# Patient Record
Sex: Female | Born: 1975 | Race: White | Hispanic: No | Marital: Married | Smoking: Never smoker
Health system: Southern US, Community
[De-identification: ages and names within clinical notes are randomized; demographics above are authoritative.]

## PROBLEM LIST (undated history)

## (undated) DIAGNOSIS — E559 Vitamin D deficiency, unspecified: Secondary | ICD-10-CM

## (undated) DIAGNOSIS — N390 Urinary tract infection, site not specified: Secondary | ICD-10-CM

## (undated) DIAGNOSIS — B343 Parvovirus infection, unspecified: Secondary | ICD-10-CM

## (undated) DIAGNOSIS — T7840XA Allergy, unspecified, initial encounter: Secondary | ICD-10-CM

## (undated) DIAGNOSIS — Z8669 Personal history of other diseases of the nervous system and sense organs: Secondary | ICD-10-CM

## (undated) DIAGNOSIS — K219 Gastro-esophageal reflux disease without esophagitis: Secondary | ICD-10-CM

## (undated) DIAGNOSIS — B029 Zoster without complications: Secondary | ICD-10-CM

## (undated) HISTORY — DX: Urinary tract infection, site not specified: N39.0

## (undated) HISTORY — PX: WISDOM TOOTH EXTRACTION: SHX21

## (undated) HISTORY — DX: Personal history of other diseases of the nervous system and sense organs: Z86.69

## (undated) HISTORY — DX: Gastro-esophageal reflux disease without esophagitis: K21.9

## (undated) HISTORY — DX: Zoster without complications: B02.9

## (undated) HISTORY — PX: COLONOSCOPY: SHX174

## (undated) HISTORY — DX: Vitamin D deficiency, unspecified: E55.9

## (undated) HISTORY — DX: Parvovirus infection, unspecified: B34.3

## (undated) HISTORY — DX: Allergy, unspecified, initial encounter: T78.40XA

---

## 2000-02-03 ENCOUNTER — Other Ambulatory Visit: Admission: RE | Admit: 2000-02-03 | Discharge: 2000-02-03 | Payer: Self-pay | Admitting: *Deleted

## 2001-02-24 ENCOUNTER — Other Ambulatory Visit: Admission: RE | Admit: 2001-02-24 | Discharge: 2001-02-24 | Payer: Self-pay | Admitting: *Deleted

## 2002-02-09 ENCOUNTER — Other Ambulatory Visit: Admission: RE | Admit: 2002-02-09 | Discharge: 2002-02-09 | Payer: Self-pay | Admitting: *Deleted

## 2003-02-19 ENCOUNTER — Other Ambulatory Visit: Admission: RE | Admit: 2003-02-19 | Discharge: 2003-02-19 | Payer: Self-pay | Admitting: *Deleted

## 2004-07-28 ENCOUNTER — Inpatient Hospital Stay (HOSPITAL_COMMUNITY): Admission: AD | Admit: 2004-07-28 | Discharge: 2004-07-28 | Payer: Self-pay | Admitting: Obstetrics and Gynecology

## 2004-07-29 ENCOUNTER — Ambulatory Visit: Payer: Self-pay | Admitting: Neonatology

## 2004-07-29 ENCOUNTER — Inpatient Hospital Stay (HOSPITAL_COMMUNITY): Admission: AD | Admit: 2004-07-29 | Discharge: 2004-07-31 | Payer: Self-pay | Admitting: Obstetrics and Gynecology

## 2004-08-16 ENCOUNTER — Inpatient Hospital Stay (HOSPITAL_COMMUNITY): Admission: AD | Admit: 2004-08-16 | Discharge: 2004-08-16 | Payer: Self-pay | Admitting: Obstetrics & Gynecology

## 2004-08-22 ENCOUNTER — Inpatient Hospital Stay (HOSPITAL_COMMUNITY): Admission: AD | Admit: 2004-08-22 | Discharge: 2004-08-24 | Payer: Self-pay | Admitting: Obstetrics and Gynecology

## 2004-08-25 ENCOUNTER — Encounter: Admission: RE | Admit: 2004-08-25 | Discharge: 2004-09-24 | Payer: Self-pay | Admitting: Obstetrics and Gynecology

## 2004-10-25 ENCOUNTER — Encounter: Admission: RE | Admit: 2004-10-25 | Discharge: 2004-11-24 | Payer: Self-pay | Admitting: Obstetrics and Gynecology

## 2004-11-25 ENCOUNTER — Encounter: Admission: RE | Admit: 2004-11-25 | Discharge: 2004-12-24 | Payer: Self-pay | Admitting: Obstetrics and Gynecology

## 2004-12-25 ENCOUNTER — Encounter: Admission: RE | Admit: 2004-12-25 | Discharge: 2005-01-24 | Payer: Self-pay | Admitting: Obstetrics and Gynecology

## 2005-01-25 ENCOUNTER — Encounter: Admission: RE | Admit: 2005-01-25 | Discharge: 2005-02-23 | Payer: Self-pay | Admitting: Obstetrics and Gynecology

## 2005-02-24 ENCOUNTER — Encounter: Admission: RE | Admit: 2005-02-24 | Discharge: 2005-03-26 | Payer: Self-pay | Admitting: Obstetrics and Gynecology

## 2005-03-27 ENCOUNTER — Encounter: Admission: RE | Admit: 2005-03-27 | Discharge: 2005-04-26 | Payer: Self-pay | Admitting: Obstetrics and Gynecology

## 2006-11-05 ENCOUNTER — Inpatient Hospital Stay (HOSPITAL_COMMUNITY): Admission: AD | Admit: 2006-11-05 | Discharge: 2006-11-07 | Payer: Self-pay | Admitting: Obstetrics and Gynecology

## 2006-11-08 ENCOUNTER — Encounter: Admission: RE | Admit: 2006-11-08 | Discharge: 2006-12-08 | Payer: Self-pay | Admitting: Obstetrics and Gynecology

## 2006-12-09 ENCOUNTER — Encounter: Admission: RE | Admit: 2006-12-09 | Discharge: 2007-01-07 | Payer: Self-pay | Admitting: Obstetrics and Gynecology

## 2007-01-08 ENCOUNTER — Encounter: Admission: RE | Admit: 2007-01-08 | Discharge: 2007-02-07 | Payer: Self-pay | Admitting: Obstetrics and Gynecology

## 2007-02-08 ENCOUNTER — Encounter: Admission: RE | Admit: 2007-02-08 | Discharge: 2007-03-09 | Payer: Self-pay | Admitting: Obstetrics and Gynecology

## 2007-03-10 ENCOUNTER — Encounter: Admission: RE | Admit: 2007-03-10 | Discharge: 2007-04-09 | Payer: Self-pay | Admitting: Obstetrics and Gynecology

## 2007-04-10 ENCOUNTER — Encounter: Admission: RE | Admit: 2007-04-10 | Discharge: 2007-05-10 | Payer: Self-pay | Admitting: Obstetrics and Gynecology

## 2007-05-11 ENCOUNTER — Encounter: Admission: RE | Admit: 2007-05-11 | Discharge: 2007-06-07 | Payer: Self-pay | Admitting: Obstetrics and Gynecology

## 2007-06-08 ENCOUNTER — Encounter: Admission: RE | Admit: 2007-06-08 | Discharge: 2007-07-08 | Payer: Self-pay | Admitting: Obstetrics and Gynecology

## 2007-07-03 ENCOUNTER — Encounter: Admission: RE | Admit: 2007-07-03 | Discharge: 2007-07-03 | Payer: Self-pay | Admitting: Sports Medicine

## 2007-07-09 ENCOUNTER — Encounter: Admission: RE | Admit: 2007-07-09 | Discharge: 2007-08-07 | Payer: Self-pay | Admitting: Obstetrics and Gynecology

## 2007-08-08 ENCOUNTER — Encounter: Admission: RE | Admit: 2007-08-08 | Discharge: 2007-09-04 | Payer: Self-pay | Admitting: Obstetrics and Gynecology

## 2009-03-22 DIAGNOSIS — B029 Zoster without complications: Secondary | ICD-10-CM

## 2009-03-22 HISTORY — DX: Zoster without complications: B02.9

## 2010-01-26 ENCOUNTER — Encounter: Admission: RE | Admit: 2010-01-26 | Discharge: 2010-01-26 | Payer: Self-pay | Admitting: Family Medicine

## 2010-03-22 HISTORY — PX: ESOPHAGOGASTRODUODENOSCOPY ENDOSCOPY: SHX5814

## 2010-08-07 NOTE — Op Note (Signed)
Danielle [name], SCHOOLER               ACCOUNT NO.:  [id number]   MEDICAL RECORD NO.:  [id number]          PATIENT TYPE:  INP   LOCATION:  9126                          FACILITY:  [facility]   PHYSICIAN:  Lenoard Aden, M.D.DATE OF BIRTH:  June 23, 1975   DATE OF PROCEDURE:  08/22/2004  DATE OF DISCHARGE:                                 OPERATIVE REPORT   PREOPERATIVE DIAGNOSIS:  Decreased fetal heart rate and increased perineal  bleeding.   POSTOPERATIVE DIAGNOSIS:  Decreased fetal heart rate and increased perineal  bleeding.   OPERATION/PROCEDURE:  Outlet vacuum-assisted vaginal delivery with Kiwi.   SURGEON:  Lenoard Aden, M.D.   ANESTHESIA:  Epidural.   ESTIMATED BLOOD LOSS:  500 mL.   COMPLICATIONS:  None.   DRAINS:  None.   COUNTS:  Correct.   DISPOSITION:  The patient to recovery in good condition.   DESCRIPTION OF PROCEDURE:  Being apprised of the risks of vacuum-assistance  including cephalhematoma, scalp laceration, intracranial __________.  Kiwi  cup was placed at fetal vertex, occiput anterior position, less than 45  degrees, +3 to +4 station for two pulls over a second-degree midline  laceration of a full-term living female, Apgars 9 and 9.  Cord blood was  collected.  Placenta delivered spontaneous, intact three-vessel cord noted.  Bulb suctioning performed.  Estimated blood loss was 500 mL.  No vaginal  lacerations noted.  Repair in the standard fashion using 3-0 repeat and a 4-  0 repeat on superficial skin laceration on the bilateral labia minora,  interrupted sutures placed.  At this time good hemostasis is noted.  The  patient tolerated the procedure well and was recovering in good condition.      RJT/MEDQ  D:  08/22/2004  T:  08/23/2004  Job:  [id number]

## 2010-08-07 NOTE — H&P (Signed)
Danielle [name], Danielle [name]               ACCOUNT NO.:  [id number]   MEDICAL RECORD NO.:  [id number]          PATIENT TYPE:  MAT   LOCATION:  [facility]C                          F[facility]ILITY:  [facility]   PHYSICIAN:  Lenoard Aden, M.D.DATE OF BIRTH:  [birth year]   DATE OF ADMISSION:  DATE OF DISCHARGE:                                HISTORY & PHYSICAL   CHIEF COMPLAINT:  Preterm labor.   HISTORY OF PRESENT ILLNESS:  The patient is a 35 year old white female G1 P0  with EDD September 12, 2004 at 56 and five-sevenths weeks with preterm cervical  change status post betamethasone #2 today with subtle cervical change and  increased frequency of contractions noted on Procardia today. She has a  history of wisdom tooth removal. She has a family history of breast,  prostate, and lung cancer; tuberculosis; diabetes; stroke; rheumatoid  arthritis; hypertension; and heart disease.   PRENATAL LABORATORY DATA:  Reveals a blood type of O positive, Rh antibody  negative. Rubella immune. Hepatitis and HIV nonreactive. GBS will be pending  today.   PHYSICAL EXAMINATION:  GENERAL:  She is a well-developed, well-nourished  white female in no acute distress.  HEENT: Normal, lungs clear.  HEART:  Regular rhythm.  ABDOMEN:  Soft, gravid, nontender.  EXTREMITIES:  Show no cords.  NEUROLOGIC:  Nonfocal.  PELVIC:  Cervix is 3 cm, approximately 1.5 cm long, vertex, 0 station.  EXTREMITIES:  Show no cords.  NEUROLOGIC:  Nonfocal.   IMPRESSION:  A 33 and five-sevenths weeks intrauterine pregnancy with  preterm cervical change.   PLAN:  Admit, administer prophylactic antibiotics until GBS is resulted.  Continue Procardia. Will performed aggressive tocolysis in the next 24-48  hours as needed and monitor continuously at this time.      RJT/MEDQ  D:  07/29/2004  T:  07/29/2004  Job:  [id number]

## 2010-08-07 NOTE — Consult Note (Signed)
NAMELAWANDA, HOLZHEIMER               ACCOUNT NO.:  [id number]   MEDICAL RECORD NO.:  [id number]          PATIENT TYPE:  MAT   LOCATION:  [facility]C                          [facility]ILITY:  [facility]   PHYSICIAN:  Lenoard Aden, M.D.DATE OF BIRTH:  [birth year]   DATE OF CONSULTATION:  08/16/2004  DATE OF DISCHARGE:                                   CONSULTATION   CHIEF COMPLAINT:  Rule out labor.   HISTORY OF PRESENT ILLNESS:  The patient is a 35 year old white female, G1,  P0, EDD of September 13, 2003 at 36 weeks and one day who presents with increased  frequency of contractions today.   ALLERGIES:  She has no known drug allergies.   MEDICATIONS:  Prenatal vitamins and Procardia p.r.n.   PAST MEDICAL HISTORY:  She has history of wisdom tooth removal. No other  medical or surgical hospitalizations.   FAMILY HISTORY:  Family history of diabetes, strokes, rheumatoid arthritis.  Lung, prostate, and breast cancer.  Hypertension and varicosities.   PRENATAL LABORATORY DATA:  Remarkable for the patient being GBS positive,  blood type O positive, rubella immuned. Hepatitis/HIV negative. Ultrasound  has revealed an appropriately grown baby. The patient's pregnancy is  complicated by a preterm cervical change and a positive fibronectin. She did  receive steroids at 32 weeks.   PHYSICAL EXAMINATION:  GENERAL:  She is a well-developed, well-nourished  white female in no acute distress.  HEENT:  Normal.  LUNGS:  Clear.  HEART:  Regular rhythm.  ABDOMEN:  Soft, gravid, and nontender.  EXTREMITIES:  No cords.  NEUROLOGICAL:  Nonfocal.  PELVIC:  Cervix is 4 cm dilated, 1-1/2 cm long, vertex and 0 station. Fetal  heart rate is reactive. Contractions are every two to six minutes.   IMPRESSION:  1. A 36-1/7th week intrauterine pregnancy.  2. History of preterm cervical change with no change in cervical exam      noted from previous visit to the office.     PLAN:  Manage expectantly, monitor x1 additional  hour, recheck cervix. If  cervical change noted, we will admit and allow her to labor. However, if no  cervical change is noted, we will possibly discharge home. Have also  discussed the possibility of expectant management, IV fluids, and  intravenous antibiotics upon observation should her contractions not space  out or her labor becomes more uncomfortable. The patient and her husband are  in attendance for the plan. They understand the plan and agree.      RJT/MEDQ  D:  08/16/2004  T:  08/16/2004  Job:  [id number]   cc:   Ma Hillock

## 2010-08-07 NOTE — H&P (Signed)
Danielle [name], HAGSTROM               ACCOUNT NO.:  [id number]   MEDICAL RECORD NO.:  [id number]          PATIENT TYPE:  INP   LOCATION:  9169                          [facility]ACILITY:  [facility]   PHYSICIAN:  Lenoard Aden, M.D.DATE OF BIRTH:  1975/04/16   DATE OF ADMISSION:  08/22/2004  DATE OF DISCHARGE:                                HISTORY & PHYSICAL   CHIEF COMPLAINT:  Advanced dilatation and GBS positive at term.   HISTORY OF PRESENT ILLNESS:  The patient is a 35 year old white female G1,  P0, EDC of September 12, 2004 who presents at 37 weeks with advanced dilatation,  GBS positive and persisting contractions.   ALLERGIES:  No known drug allergies.   MEDICATIONS:  Prenatal vitamins.   Noncontributory pregnancy history.   FAMILY HISTORY:  Prostate history, lung, breast cancer. Stroke, diabetes,  tuberculosis, hypertension.   The patient has a personal history of wisdom tooth extraction.   PRENATAL LAB DATA:  Reveals a blood type of O positive. Hepatitis, HIV  negative. Toxoplasmosis negative. VDRL nonreactive. GBS positive.   Pregnancy course complicated by advanced cervical dilatation, status post  betamethasone due to preterm cervical change.   PHYSICAL EXAMINATION:  GENERAL:  She is a well-developed, well-nourished  white female in no acute distress.  HEENT:  Normal.  LUNGS:  Clear.  HEART:  Regular rhythm.  ABDOMEN:  Soft, gravid, nontender. Estimated fetal weight 6.5 pounds.  CERVIX:  Is 4 to 5 cm, presentation vertex, 0 to +1 station.  EXTREMITIES:  Show no cords.  NEUROLOGIC EXAM:  Nonfocal.   IMPRESSION:  1.  A 37-week OB.  2.  GBS positive.  3.  Advanced dilatation.   PLAN:  Proceed with induction. Risks, benefits discussed. Small risk of  prematurity noted. Patient acknowledges, will proceed. We are using  Penicillin for prophylaxis. Epidural as needed.      RJT/MEDQ  D:  08/22/2004  T:  08/22/2004  Job:  [id number]

## 2011-01-01 LAB — CBC
MCHC: 36
MCV: 98.2
Platelets: 123 — ABNORMAL LOW
Platelets: 147 — ABNORMAL LOW
RDW: 12.3
WBC: 11.6 — ABNORMAL HIGH

## 2011-01-01 LAB — RPR: RPR Ser Ql: NONREACTIVE

## 2011-01-01 LAB — CCBB MATERNAL DONOR DRAW

## 2011-03-23 DIAGNOSIS — B343 Parvovirus infection, unspecified: Secondary | ICD-10-CM

## 2011-03-23 HISTORY — DX: Parvovirus infection, unspecified: B34.3

## 2011-09-30 ENCOUNTER — Ambulatory Visit (HOSPITAL_COMMUNITY)
Admission: RE | Admit: 2011-09-30 | Discharge: 2011-09-30 | Disposition: A | Discharge status: Home / Self Care | Payer: BC Managed Care – PPO | Source: Ambulatory Visit | Attending: Internal Medicine | Admitting: Internal Medicine

## 2011-09-30 ENCOUNTER — Other Ambulatory Visit (HOSPITAL_COMMUNITY): Payer: Self-pay | Admitting: Internal Medicine

## 2011-09-30 DIAGNOSIS — R5383 Other fatigue: Secondary | ICD-10-CM | POA: Insufficient documentation

## 2011-09-30 DIAGNOSIS — R5381 Other malaise: Secondary | ICD-10-CM | POA: Insufficient documentation

## 2011-09-30 DIAGNOSIS — R0602 Shortness of breath: Secondary | ICD-10-CM | POA: Insufficient documentation

## 2011-09-30 DIAGNOSIS — R209 Unspecified disturbances of skin sensation: Secondary | ICD-10-CM | POA: Insufficient documentation

## 2011-09-30 DIAGNOSIS — M79609 Pain in unspecified limb: Secondary | ICD-10-CM

## 2012-12-29 ENCOUNTER — Encounter: Payer: Self-pay | Admitting: *Deleted

## 2012-12-29 DIAGNOSIS — E559 Vitamin D deficiency, unspecified: Secondary | ICD-10-CM | POA: Insufficient documentation

## 2012-12-29 DIAGNOSIS — K219 Gastro-esophageal reflux disease without esophagitis: Secondary | ICD-10-CM | POA: Insufficient documentation

## 2012-12-29 DIAGNOSIS — N390 Urinary tract infection, site not specified: Secondary | ICD-10-CM

## 2013-01-23 ENCOUNTER — Other Ambulatory Visit: Payer: Self-pay | Admitting: Internal Medicine

## 2013-01-23 ENCOUNTER — Encounter: Payer: Self-pay | Admitting: Internal Medicine

## 2013-01-23 ENCOUNTER — Encounter (INDEPENDENT_AMBULATORY_CARE_PROVIDER_SITE_OTHER): Payer: Self-pay

## 2013-01-23 ENCOUNTER — Ambulatory Visit: Payer: BC Managed Care – PPO | Admitting: Internal Medicine

## 2013-01-23 VITALS — BP 106/66 | HR 68 | Temp 99.1°F | Resp 18 | Ht 65.5 in | Wt 121.6 lb

## 2013-01-23 DIAGNOSIS — Z0000 Encounter for general adult medical examination without abnormal findings: Secondary | ICD-10-CM

## 2013-01-23 DIAGNOSIS — Z1212 Encounter for screening for malignant neoplasm of rectum: Secondary | ICD-10-CM

## 2013-01-23 DIAGNOSIS — E559 Vitamin D deficiency, unspecified: Secondary | ICD-10-CM

## 2013-01-23 DIAGNOSIS — Z113 Encounter for screening for infections with a predominantly sexual mode of transmission: Secondary | ICD-10-CM

## 2013-01-23 LAB — CBC WITH DIFFERENTIAL/PLATELET
Eosinophils Absolute: 0.1 10*3/uL (ref 0.0–0.7)
Eosinophils Relative: 1 % (ref 0–5)
Lymphs Abs: 1.4 10*3/uL (ref 0.7–4.0)
MCH: 31.7 pg (ref 26.0–34.0)
MCV: 95.2 fL (ref 78.0–100.0)
Monocytes Relative: 6 % (ref 3–12)
Platelets: 184 10*3/uL (ref 150–400)
RBC: 4.19 MIL/uL (ref 3.87–5.11)

## 2013-01-23 LAB — HEMOGLOBIN A1C: Mean Plasma Glucose: 100 mg/dL (ref <117)

## 2013-01-23 LAB — MAGNESIUM: Magnesium: 2 mg/dL (ref 1.5–2.5)

## 2013-01-23 LAB — BASIC METABOLIC PANEL WITH GFR
Chloride: 104 mEq/L (ref 96–112)
Creat: 0.87 mg/dL (ref 0.50–1.10)
GFR, Est Non African American: 85 mL/min
Potassium: 4.2 mEq/L (ref 3.5–5.3)
Sodium: 137 mEq/L (ref 135–145)

## 2013-01-23 LAB — VITAMIN B12: Vitamin B-12: 637 pg/mL (ref 211–911)

## 2013-01-23 LAB — LIPID PANEL
Cholesterol: 167 mg/dL (ref 0–200)
Total CHOL/HDL Ratio: 2.3 Ratio

## 2013-01-23 LAB — HEPATIC FUNCTION PANEL
AST: 15 U/L (ref 0–37)
Albumin: 4.4 g/dL (ref 3.5–5.2)
Alkaline Phosphatase: 48 U/L (ref 39–117)
Bilirubin, Direct: 0.1 mg/dL (ref 0.0–0.3)
Indirect Bilirubin: 0.6 mg/dL (ref 0.0–0.9)
Total Bilirubin: 0.7 mg/dL (ref 0.3–1.2)

## 2013-01-23 LAB — IRON AND TIBC
%SAT: 15 % — ABNORMAL LOW (ref 20–55)
Iron: 59 ug/dL (ref 42–145)
TIBC: 383 ug/dL (ref 250–470)

## 2013-01-23 MED ORDER — VITAMIN D 50 MCG (2000 UT) PO TABS: 2000.0000 [IU] | ORAL_TABLET | Freq: Every day | ORAL | Status: DC

## 2013-01-23 NOTE — Progress Notes (Signed)
Patient ID: Danielle [name], female   DOB: [birth year], 37 y.o.   MRN: [id number]  Complete Physical HPI Patient presents for complete physical. Primary c/o relates to ongoing Heartburn/Relux dening all usual dietary triggers.Patient denies chest pain, shortness of breath, dizziness.  . she has been working on diet and exercise . She denies changes in vision, polys, and paresthesias. .  Current outpatient prescriptions:cholecalciferol (VITAMIN D) 2000 UNITS tablet, Take 2,000 Units by mouth., Disp: , Rfl: ; Disp: , Rfl: ;  fish oil-omega-3 fatty acids 1000 MG capsule, Take 2 g by mouth daily., Disp: , Rfl: ;  Multiple Vitamins-Minerals (MULTIVITAMIN WITH MINERALS) tablet, Take 1 tablet by mouth daily., Disp: , WUJ:WJXBJYN w/Bioperine 500/10 ranitidine (ZANTAC) 75 MG tablet, Take 75 mg by mouth 2 (two) times daily., Disp: , Rfl: . @IMMTABLE @ Allergies  Allergen Reactions  . Sulfa Antibiotics   . Levaquin [Levofloxacin In D5w] Rash   Past Medical History  Diagnosis Date  . GERD (gastroesophageal reflux disease)   . Vitamin D deficiency   . UTI (lower urinary tract infection)   . Shingles 2011  . Parvovirus infection 2013   Past Surgical History  Procedure Laterality Date  . Esophagogastroduodenoscopy endoscopy  2012   Family History  Problem Relation Age of Onset  . Cancer Maternal Grandmother     breast  . Cancer Maternal Grandfather     lung  . Cancer Paternal Grandfather     prostate  . Mitral valve prolapse Mother    History   Social History  . Marital Status: Married    Spouse Name: N/A    Number of Children: N/A  . Years of Education: N/A   Occupational History  . Not on file.   Social History Main Topics  . Smoking status: Never Smoker   . Smokeless tobacco: Not on file  . Alcohol Use: No  . Drug Use: No  . Sexual Activity: Not on file   Other Topics Concern  . Not on file   Social History Narrative  . No narrative on file   ROS Constitutional: Denies  fever, chills, weight loss/gain, headaches, insomnia, fatigue, night sweats, and change in appetite. Eyes: Denies redness, blurred vision, diplopia, discharge, itchy, watery eyes.  ENT: Denies discharge, post nasal drip, epistaxis, sore throat, earache, hearing loss, dental pain, Tinnitus, Vertigo, Sinus pain, snoring. Does have problems with PND and sinus congestion. Cardio: Denies chest pain, irregular heartbeat, syncope, dyspnea, diaphoresis, orthopnea, PND, claudication, edema. Does have occas. Palpitations. Respiratory: denies cough, dyspnea, DOE.  Gastrointestinal: Denies dysphagia, heartburn, reflux, water brash, pain, cramps, nausea, vomiting, bloating, diarrhea, constipation, hematemesis, melena, hematochezia, jaundice, hemorrhoids Genitourinary: Denies dysuria, frequency, urgency, nocturia, hesitancy, discharge, hematuria, flank pain Musculoskeletal: Denies arthralgia, myalgia, stiffness, Jt. Swelling, pain, limp, and strain/sprain. Skin: Denies puritis, rash, eczema, changing in skin lesion Neuro: Weakness, tremor, incoordination, spasms, paresthesia, pain Psychiatric: Denies confusion, memory loss, sensory loss Endocrine: Denies change in weight, skin, hair change, nocturia, and paresthesia, Diabetic Polys, visual blurring, hyper /hypo glycemic episodes.  Heme/Lymph: Excessive bleeding, bruising, enlarged lymph nodes Filed Vitals:   01/23/13 0931  BP: 106/66  Pulse: 68  Temp: 99.1 F (37.3 C)  Resp: 18   Physical Exam: General Appearance: Well nourished, in no apparent distress. Eyes: PERRLA, EOMs, conjunctiva no swelling or erythema, normal fundi and vessels. Sinuses: No Frontal/maxillary tenderness ENT/Mouth: Ext aud canals clear, normal light reflex with TMs without erythema, bulging. Nares clear with no erythema, swelling, mucus on turbinates. No ulcers, cracking, on  lips. Good dentition. No erythema, swelling, or exudate on post pharynx. Tongue normal, non-obstructing.  Tonsils not swollen or erythematous. Hearing normal.  Neck: Supple, thyroid normal. No bruits or JVD. Respiratory: Respiratory effort normal, BS equal bilaterally without rales, rhonci, wheezing or stridor. Cardio: Heart sounds normal, regular rate and rhythm without murmurs, rubs or gallops. Peripheral pulses brisk and equal bilaterally, without edema. No aortic or femoral bruits. Chest: symmetric, with normal excursions and percussion. Breasts: deferred to Dr . [place]. Abdomen: Flat, soft, with bowl sounds. Nontender, no guarding, rebound, hernias, masses, or organomegaly.  Lymphatics: Non tender without lymphadenopathy.  Musculoskeletal: Full ROM all peripheral extremities, joint stability, 5/5 strength, and normal gait. Skin: Warm, dry without rashes, lesions, ecchymosis.  Neuro: Cranial nerves intact, reflexes equal bilaterally. Normal muscle tone, no cerebellar symptoms. Sensation intact.  Pysch: Awake and oriented X 3, normal affect, Insight and Judgment appropriate.   Assessment and Plan Patient Active Problem List   Diagnosis Date Noted  . GERD (gastroesophageal reflux disease) 12/29/2012  . Urinary tract infection, site not specified 12/29/2012  . Vitamin D deficiency

## 2013-01-23 NOTE — Patient Instructions (Signed)
Diet and Meds as discussed

## 2013-01-24 ENCOUNTER — Telehealth: Payer: Self-pay | Admitting: *Deleted

## 2013-01-24 LAB — URINALYSIS, COMPLETE
Bacteria, UA: NONE SEEN
Bilirubin Urine: NEGATIVE
Casts: NONE SEEN
Crystals: NONE SEEN
Ketones, ur: NEGATIVE mg/dL
Specific Gravity, Urine: 1.011 (ref 1.005–1.030)
Urobilinogen, UA: 0.2 mg/dL (ref 0.0–1.0)
pH: 6.5 (ref 5.0–8.0)

## 2013-01-24 LAB — MICROALBUMIN / CREATININE URINE RATIO
Microalb Creat Ratio: 7.4 mg/g (ref 0.0–30.0)
Microalb, Ur: 0.5 mg/dL (ref 0.00–1.89)

## 2013-01-24 NOTE — Telephone Encounter (Signed)
Reviewed labs with pt. 

## 2013-01-24 NOTE — Telephone Encounter (Signed)
Message copied by Reggy Eye on Wed Jan 24, 2013  5:44 PM ------      Message from: Lucky Cowboy      Created: Wed Jan 24, 2013  4:23 PM       All OK except still mild anemia & need to restart iron - cholesterol great - low risk heart problems - Vit D low - Increase 2000 to 4000 u/da ------

## 2013-01-25 ENCOUNTER — Other Ambulatory Visit: Payer: Self-pay

## 2013-01-25 LAB — HIV ANTIBODY (ROUTINE TESTING W REFLEX): HIV: NONREACTIVE

## 2013-07-27 ENCOUNTER — Ambulatory Visit: Payer: Self-pay | Admitting: Podiatrist

## 2013-07-30 ENCOUNTER — Ambulatory Visit (INDEPENDENT_AMBULATORY_CARE_PROVIDER_SITE_OTHER): Payer: BC Managed Care – PPO | Admitting: Podiatry

## 2013-07-30 ENCOUNTER — Encounter: Payer: Self-pay | Admitting: Podiatry

## 2013-07-30 VITALS — BP 106/62 | HR 71 | Resp 16

## 2013-07-30 DIAGNOSIS — B079 Viral wart, unspecified: Secondary | ICD-10-CM

## 2013-07-30 NOTE — Progress Notes (Signed)
   Subjective:    Patient ID: Danielle [name], female    DOB: 05/24/1975, 38 y.o.   MRN: [id number]  HPI Comments: "I have a wart, I think"  Patient c/o painful, callused area sub 1st MPJ left for 3 months. Very sensitive. She says her husband had a wart on his foot that he had cut out. Tried OTC wart removers-no help.     Review of Systems  All other systems reviewed and are negative.      Objective:   Physical Exam        Assessment & Plan:

## 2013-07-30 NOTE — Progress Notes (Signed)
Subjective:     Patient ID: Danielle [name], female   DOB: [birth year], 38 y.o.   MRN: [id number]  Foot Pain   patient states I think I have a wart underneath my left foot that has been bothering me for a few months and making it hard for me to walk. States she's had one on her thumb for 6 months   Review of Systems  All other systems reviewed and are negative.      Objective:   Physical Exam  Nursing note and vitals reviewed. Constitutional: She is oriented to person, place, and time.  Cardiovascular: Intact distal pulses.   Musculoskeletal: Normal range of motion.  Neurological: She is oriented to person, place, and time.  Skin: Skin is warm.   neurovascular status intact muscle strength adequate in range of motion of the subtalar and midtarsal joint within normal limits. Patient is found to have keratotic lesion sub-first metatarsal left it's painful when pressed with pinpoint bleeding noted upon debridement and pain mostly the lateral pressure. Measures about 7 mm x 8 mm     Assessment:     Probable verruca plantaris plantar left first metatarsal    Plan:     H&P performed and conditions discussed explained. I have recommended excision with pathology and I reviewed the procedure with patient. I infiltrated with 60 mg Xylocaine with epinephrine and after appropriate numbness I excised the lesion entirely and applied phenol to the base. The  lesion looked like verruca but I did send to pathology and we will have results within 7-10 days. Reappoint if any issues should occur

## 2013-07-30 NOTE — Patient Instructions (Signed)

## 2013-07-31 ENCOUNTER — Telehealth: Payer: Self-pay | Admitting: *Deleted

## 2013-07-31 NOTE — Telephone Encounter (Signed)
Skin Wedge 1st MPJ Left Foot sent to [facility] to rule out Verruca.  [facility] [place] [address] Ph: [phone]

## 2013-09-07 ENCOUNTER — Encounter: Payer: Self-pay | Admitting: Podiatry

## 2014-01-23 ENCOUNTER — Encounter: Payer: Self-pay | Admitting: Internal Medicine

## 2014-01-23 ENCOUNTER — Ambulatory Visit (INDEPENDENT_AMBULATORY_CARE_PROVIDER_SITE_OTHER): Payer: BC Managed Care – PPO | Admitting: Internal Medicine

## 2014-01-23 VITALS — BP 116/72 | HR 64 | Temp 98.4°F | Resp 16 | Ht 65.5 in | Wt 122.6 lb

## 2014-01-23 DIAGNOSIS — E559 Vitamin D deficiency, unspecified: Secondary | ICD-10-CM

## 2014-01-23 DIAGNOSIS — K2100 Gastro-esophageal reflux disease with esophagitis, without bleeding: Secondary | ICD-10-CM

## 2014-01-23 DIAGNOSIS — Z79899 Other long term (current) drug therapy: Secondary | ICD-10-CM

## 2014-01-23 DIAGNOSIS — Z1211 Encounter for screening for malignant neoplasm of colon: Secondary | ICD-10-CM

## 2014-01-23 DIAGNOSIS — Z111 Encounter for screening for respiratory tuberculosis: Secondary | ICD-10-CM

## 2014-01-23 DIAGNOSIS — R7989 Other specified abnormal findings of blood chemistry: Secondary | ICD-10-CM

## 2014-01-23 DIAGNOSIS — R945 Abnormal results of liver function studies: Secondary | ICD-10-CM

## 2014-01-23 DIAGNOSIS — Z0001 Encounter for general adult medical examination with abnormal findings: Secondary | ICD-10-CM

## 2014-01-23 DIAGNOSIS — R6889 Other general symptoms and signs: Secondary | ICD-10-CM

## 2014-01-23 LAB — CBC WITH DIFFERENTIAL/PLATELET
BASOS PCT: 0 % (ref 0–1)
Basophils Absolute: 0 10*3/uL (ref 0.0–0.1)
Eosinophils Absolute: 0.1 10*3/uL (ref 0.0–0.7)
Eosinophils Relative: 2 % (ref 0–5)
HEMATOCRIT: 38.6 % (ref 36.0–46.0)
Hemoglobin: 13.4 g/dL (ref 12.0–15.0)
LYMPHS PCT: 25 % (ref 12–46)
Lymphs Abs: 1.2 10*3/uL (ref 0.7–4.0)
MCH: 32.4 pg (ref 26.0–34.0)
MCHC: 34.7 g/dL (ref 30.0–36.0)
MCV: 93.5 fL (ref 78.0–100.0)
MONO ABS: 0.5 10*3/uL (ref 0.1–1.0)
Monocytes Relative: 10 % (ref 3–12)
NEUTROS PCT: 63 % (ref 43–77)
Neutro Abs: 3 10*3/uL (ref 1.7–7.7)
Platelets: 188 10*3/uL (ref 150–400)
RBC: 4.13 MIL/uL (ref 3.87–5.11)
RDW: 12.3 % (ref 11.5–15.5)
WBC: 4.7 10*3/uL (ref 4.0–10.5)

## 2014-01-23 LAB — HEMOGLOBIN A1C
Hgb A1c MFr Bld: 5.2 % (ref <5.7)
Mean Plasma Glucose: 103 mg/dL (ref <117)

## 2014-01-23 MED ORDER — RANITIDINE HCL 300 MG PO TABS: ORAL_TABLET | ORAL | Status: AC

## 2014-01-23 MED ORDER — ISOMETHEPTENE-APAP-DICHLORAL 65-325-100 MG PO CAPS: ORAL_CAPSULE | ORAL | Status: DC

## 2014-01-23 NOTE — Progress Notes (Signed)
Patient ID: Danielle [name], female   DOB: 24-Feb-1976, 38 y.o.   MRN: [id number]  Annual Screening Comprehensive Examination  This very nice 38 y.o.female presents for complete physical.  Patient has been followed for HTN, T2_NIDDM  Prediabetes, Hyperlipidemia, and Vitamin D Deficiency.   Patient also reports a vague HA which she feels relates to occuring 5-7 days following exercise. Denies aura, visual abnormalities. Has tried Tylenol and OTC NSAIDs w/o benefit.    Patient does ha hx/o GERD and has been having break thru dyspepsia on bid dosing with low dose Zantac 75 mg bid. She doe try to avoid her recognized food triggers. Denies Waterbrash, dysphagia or N/V/D/C.     Patient is monitored for elevated BP and patient denies any cardiac symptoms as chest pain, palpitations, shortness of breath, dizziness or ankle swelling. Today's BP: 116/72 mmHg    Patient's hyperlipidemia is controlled with diet and medications. Patient denies myalgias or other medication SE's. Last lipids were TC 151. TG 59, HDL 58 and LDL 81 in Sept 2013.   Patient is monitored and screened for prediabetes  and patient denies reactive hypoglycemic symptoms, visual blurring, diabetic polys or paresthesias. Last A1c was 5.2% in Oct 2013.   Finally, patient has history of Vitamin D Deficiency and last Vitamin D was 27 in Oct 2013.  Medication Sig  . Chlorpheniramine Maleate  Take by mouth.  . cholecalciferol 2000 UNITS tablet Take 1 tablet (2,000 Units total) by mouth daily.  . fish oil 1000 MG capsule Take 2 g by mouth daily.  . ranitidine 75 MG tablet Take 75 mg by mouth 2 (two) times daily.   Allergies  Allergen Reactions  . Sulfa Antibiotics   . Levaquin [Levofloxacin In D5w] Rash    Past Medical History  Diagnosis Date  . GERD (gastroesophageal reflux disease)   . Vitamin D deficiency   . UTI (lower urinary tract infection)   . Shingles 2011  . Parvovirus infection 2013   Health Maintenance  Topic Date Due   . PAP SMEAR  04/19/1992  . INFLUENZA VACCINE  10/20/2013  . TETANUS/TDAP  01/30/2016   Immunization History  Administered Date(s) Administered  . Influenza Whole 12/30/2011  . Influenza-Unspecified 01/21/2014  . PPD Test 01/23/2013  . Pneumococcal Conjugate-13 12/30/2011  . Td 01/29/2006   Past Surgical History  Procedure Laterality Date  . Esophagogastroduodenoscopy endoscopy  2012   Family History  Problem Relation Age of Onset  . Cancer Maternal Grandmother     breast  . Cancer Maternal Grandfather     lung  . Cancer Paternal Grandfather     prostate  . Mitral valve prolapse Mother    History  Substance Use Topics  . Smoking status: Never Smoker   . Smokeless tobacco: Not on file  . Alcohol Use: No    ROS  Constitutional: Denies fever, chills, weight loss/gain, headaches, insomnia, fatigue, night sweats, and change in appetite. Eyes: Denies redness, blurred vision, diplopia, discharge, itchy, watery eyes.  ENT: Denies discharge, congestion, post nasal drip, epistaxis, sore throat, earache, hearing loss, dental pain, Tinnitus, Vertigo, Sinus pain, snoring.  Cardio: Denies chest pain, palpitations, irregular heartbeat, syncope, dyspnea, diaphoresis, orthopnea, PND, claudication, edema Respiratory: denies cough, dyspnea, DOE, pleurisy, hoarseness, laryngitis, wheezing.  Gastrointestinal: Denies dysphagia, heartburn, reflux, water brash, pain, cramps, nausea, vomiting, bloating, diarrhea, constipation, hematemesis, melena, hematochezia, jaundice, hemorrhoids Genitourinary: Denies dysuria, frequency, urgency, nocturia, hesitancy, discharge, hematuria, flank pain Breast: Breast lumps, nipple discharge, bleeding.  Musculoskeletal: Denies arthralgia, myalgia,  stiffness, Jt. Swelling, pain, limp, and strain/sprain. Denies falls. Skin: Denies puritis, rash, hives, warts, acne, eczema, changing in skin lesion Neuro: No weakness, tremor, incoordination, spasms, paresthesia,  pain Psychiatric: Denies confusion, memory loss, sensory loss. Denies Depression. Endocrine: Denies change in weight, skin, hair change, nocturia, and paresthesia, diabetic polys, visual blurring, hyper / hypo glycemic episodes.  Heme/Lymph: No excessive bleeding, bruising, enlarged lymph nodes.  Physical Exam  BP 116/72 mmHg  Pulse 64  Temp(Src) 98.4 F (36.9 C)  Resp 16  Ht 5' 5.5" (1.664 m)  Wt 122 lb 9.6 oz (55.611 kg)  BMI 20.08 kg/m2  General Appearance: Well nourished and in no apparent distress. Eyes: PERRLA, EOMs, conjunctiva no swelling or erythema, normal fundi and vessels. Sinuses: No frontal/maxillary tenderness ENT/Mouth: EACs patent / TMs  nl. Nares clear without erythema, swelling, mucoid exudates. Oral hygiene is good. No erythema, swelling, or exudate. Tongue normal, non-obstructing. Tonsils not swollen or erythematous. Hearing normal.  Neck: Supple, thyroid normal. No bruits, nodes or JVD. Respiratory: Respiratory effort normal.  BS equal and clear bilateral without rales, rhonci, wheezing or stridor. Cardio: Heart sounds are normal with regular rate and rhythm and no murmurs, rubs or gallops. Peripheral pulses are normal and equal bilaterally without edema. No aortic or femoral bruits. Chest: symmetric with normal excursions and percussion. Breasts: Symmetric, without lumps, nipple discharge, retractions, or fibrocystic changes.  Abdomen: Flat, soft, with bowl sounds. Nontender, no guarding, rebound, hernias, masses, or organomegaly.  Lymphatics: Non tender without lymphadenopathy.  Genitourinary:  Musculoskeletal: Full ROM all peripheral extremities, joint stability, 5/5 strength, and normal gait. Skin: Warm and dry without rashes, lesions, cyanosis, clubbing or  ecchymosis.  Neuro: Cranial nerves intact, reflexes equal bilaterally. Normal muscle tone, no cerebellar symptoms. Sensation intact.  Pysch: Awake and oriented X 3, normal affect, Insight and Judgment  appropriate.   Assessment and Plan  1. Annual Screening Examination 2. GERD 3. PreDiabetes Screening 4. Vitamin D Deficiency 5. Headache   Continue prudent diet as discussed, weight control, BP monitoring, regular exercise, and medications. Discussed med's effects and SE's. Screening labs and tests as requested with regular follow-up as recommended.  New rx increasing Ranitidine to 300 mg w/supper. Also Trial w/ Midrin for HA's.

## 2014-01-23 NOTE — Patient Instructions (Signed)
Recommend the book "The END of DIETING" by Dr Baker Janus   and the book "The END of DIABETES " by Dr Excell Seltzer  At [email or web address] - get book & Audio CD's      Being diabetic has a  300% increased risk for heart attack, stroke, cancer, and alzheimer- type vascular dementia. It is very important that you work harder with diet by avoiding all foods that are white except chicken & fish. Avoid white rice (brown & wild rice is OK), white potatoes (sweetpotatoes in moderation is OK), White bread or wheat bread or anything made out of white flour like bagels, donuts, rolls, buns, biscuits, cakes, pastries, cookies, pizza crust, and pasta (made from white flour & egg whites) - vegetarian pasta or spinach or wheat pasta is OK. Multigrain breads like Arnold's or [employer], or multigrain sandwich thins or flatbreads.  Diet, exercise and weight loss can reverse and cure diabetes in the early stages.  Diet, exercise and weight loss is very important in the control and prevention of complications of diabetes which affects every system in your body, ie. Brain - dementia/stroke, eyes - glaucoma/blindness, heart - heart attack/heart failure, kidneys - dialysis, stomach - gastric paralysis, intestines - malabsorption, nerves - severe painful neuritis, circulation - gangrene & loss of a leg(s), and finally cancer and Alzheimers.    I recommend avoid fried & greasy foods,  sweets/candy, white rice (brown or wild rice or Quinoa is OK), white potatoes (sweet potatoes are OK) - anything made from white flour - bagels, doughnuts, rolls, buns, biscuits,white and wheat breads, pizza crust and traditional pasta made of white flour & egg white(vegetarian pasta or spinach or wheat pasta is OK).  Multi-grain bread is OK - like multi-grain flat bread or sandwich thins. Avoid alcohol in excess. Exercise is also important.    Eat all the vegetables you want - avoid meat, especially red meat and dairy - especially cheese.  Cheese  is the most concentrated form of trans-fats which is the worst thing to clog up our arteries. Veggie cheese is OK which can be found in the fresh produce section at [employer] or [employer] or [employer]  Preventive Care for Adults A healthy lifestyle and preventive care can promote health and wellness. Preventive health guidelines for women include the following key practices.  A routine yearly physical is a good way to check with your health care provider about your health and preventive screening. It is a chance to share any concerns and updates on your health and to receive a thorough exam.  Visit your dentist for a routine exam and preventive care every 6 months. Brush your teeth twice a day and floss once a day. Good oral hygiene prevents tooth decay and gum disease.  The frequency of eye exams is based on your age, health, family medical history, use of contact lenses, and other factors. Follow your health care provider's recommendations for frequency of eye exams.  Eat a healthy diet. Foods like vegetables, fruits, whole grains, low-fat dairy products, and lean protein foods contain the nutrients you need without too many calories. Decrease your intake of foods high in solid fats, added sugars, and salt. Eat the right amount of calories for you.Get information about a proper diet from your health care provider, if necessary.  Regular physical exercise is one of the most important things you can do for your health. Most adults should get at least 150 minutes of moderate-intensity exercise (any activity that increases  your heart rate and causes you to sweat) each week. In addition, most adults need muscle-strengthening exercises on 2 or more days a week.  Maintain a healthy weight. The body mass index (BMI) is a screening tool to identify possible weight problems. It provides an estimate of body fat based on height and weight. Your health care provider can find your BMI and can help you  achieve or maintain a healthy weight.For adults 20 years and older:  A BMI below 18.5 is considered underweight.  A BMI of 18.5 to 24.9 is normal.  A BMI of 25 to 29.9 is considered overweight.  A BMI of 30 and above is considered obese.  Maintain normal blood lipids and cholesterol levels by exercising and minimizing your intake of saturated fat. Eat a balanced diet with plenty of fruit and vegetables. Blood tests for lipids and cholesterol should begin at age 43 and be repeated every 5 years. If your lipid or cholesterol levels are high, you are over 50, or you are at high risk for heart disease, you may need your cholesterol levels checked more frequently.Ongoing high lipid and cholesterol levels should be treated with medicines if diet and exercise are not working.  If you smoke, find out from your health care provider how to quit. If you do not use tobacco, do not start.  Lung cancer screening is recommended for adults aged 40-80 years who are at high risk for developing lung cancer because of a history of smoking. A yearly low-dose CT scan of the lungs is recommended for people who have at least a 30-pack-year history of smoking and are a current smoker or have quit within the past 15 years. A pack year of smoking is smoking an average of 1 pack of cigarettes a day for 1 year (for example: 1 pack a day for 30 years or 2 packs a day for 15 years). Yearly screening should continue until the smoker has stopped smoking for at least 15 years. Yearly screening should be stopped for people who develop a health problem that would prevent them from having lung cancer treatment.  If you are pregnant, do not drink alcohol. If you are breastfeeding, be very cautious about drinking alcohol. If you are not pregnant and choose to drink alcohol, do not have more than 1 drink per day. One drink is considered to be 12 ounces (355 mL) of beer, 5 ounces (148 mL) of wine, or 1.5 ounces (44 mL) of liquor.  Avoid  use of street drugs. Do not share needles with anyone. Ask for help if you need support or instructions about stopping the use of drugs.  High blood pressure causes heart disease and increases the risk of stroke. Your blood pressure should be checked at least every 1 to 2 years. Ongoing high blood pressure should be treated with medicines if weight loss and exercise do not work.  If you are 74-43 years old, ask your health care provider if you should take aspirin to prevent strokes.  Diabetes screening involves taking a blood sample to check your fasting blood sugar level. This should be done once every 3 years, after age 105, if you are within normal weight and without risk factors for diabetes. Testing should be considered at a younger age or be carried out more frequently if you are overweight and have at least 1 risk factor for diabetes.  Breast cancer screening is essential preventive care for women. You should practice "breast self-awareness." This means understanding the  normal appearance and feel of your breasts and may include breast self-examination. Any changes detected, no matter how small, should be reported to a health care provider. Women in their 77s and 30s should have a clinical breast exam (CBE) by a health care provider as part of a regular health exam every 1 to 3 years. After age 52, women should have a CBE every year. Starting at age 59, women should consider having a mammogram (breast X-ray test) every year. Women who have a family history of breast cancer should talk to their health care provider about genetic screening. Women at a high risk of breast cancer should talk to their health care providers about having an MRI and a mammogram every year.  Breast cancer gene (BRCA)-related cancer risk assessment is recommended for women who have family members with BRCA-related cancers. BRCA-related cancers include breast, ovarian, tubal, and peritoneal cancers. Having family members with  these cancers may be associated with an increased risk for harmful changes (mutations) in the breast cancer genes BRCA1 and BRCA2. Results of the assessment will determine the need for genetic counseling and BRCA1 and BRCA2 testing.  Routine pelvic exams to screen for cancer are no longer recommended for nonpregnant women who are considered low risk for cancer of the pelvic organs (ovaries, uterus, and vagina) and who do not have symptoms. Ask your health care provider if a screening pelvic exam is right for you.  If you have had past treatment for cervical cancer or a condition that could lead to cancer, you need Pap tests and screening for cancer for at least 20 years after your treatment. If Pap tests have been discontinued, your risk factors (such as having a new sexual partner) need to be reassessed to determine if screening should be resumed. Some women have medical problems that increase the chance of getting cervical cancer. In these cases, your health care provider may recommend more frequent screening and Pap tests.  The HPV test is an additional test that may be used for cervical cancer screening. The HPV test looks for the virus that can cause the cell changes on the cervix. The cells collected during the Pap test can be tested for HPV. The HPV test could be used to screen women aged 43 years and older, and should be used in women of any age who have unclear Pap test results. After the age of 50, women should have HPV testing at the same frequency as a Pap test.  Colorectal cancer can be detected and often prevented. Most routine colorectal cancer screening begins at the age of 30 years and continues through age 66 years. However, your health care provider may recommend screening at an earlier age if you have risk factors for colon cancer. On a yearly basis, your health care provider may provide home test kits to check for hidden blood in the stool. Use of a small camera at the end of a tube, to  directly examine the colon (sigmoidoscopy or colonoscopy), can detect the earliest forms of colorectal cancer. Talk to your health care provider about this at age 43, when routine screening begins. Direct exam of the colon should be repeated every 5-10 years through age 56 years, unless early forms of pre-cancerous polyps or small growths are found.  People who are at an increased risk for hepatitis B should be screened for this virus. You are considered at high risk for hepatitis B if:  You were born in a country where hepatitis B occurs  often. Talk with your health care provider about which countries are considered high risk.  Your parents were born in a high-risk country and you have not received a shot to protect against hepatitis B (hepatitis B vaccine).  You have HIV or AIDS.  You use needles to inject street drugs.  You live with, or have sex with, someone who has hepatitis B.  You get hemodialysis treatment.  You take certain medicines for conditions like cancer, organ transplantation, and autoimmune conditions.  Hepatitis C blood testing is recommended for all people born from [birth year] through 1966 and any individual with known risks for hepatitis C.  Practice safe sex. Use condoms and avoid high-risk sexual practices to reduce the spread of sexually transmitted infections (STIs). STIs include gonorrhea, chlamydia, syphilis, trichomonas, herpes, HPV, and human immunodeficiency virus (HIV). Herpes, HIV, and HPV are viral illnesses that have no cure. They can result in disability, cancer, and death.  You should be screened for sexually transmitted illnesses (STIs) including gonorrhea and chlamydia if:  You are sexually active and are younger than 24 years.  You are older than 24 years and your health care provider tells you that you are at risk for this type of infection.  Your sexual activity has changed since you were last screened and you are at an increased risk for chlamydia or  gonorrhea. Ask your health care provider if you are at risk.  If you are at risk of being infected with HIV, it is recommended that you take a prescription medicine daily to prevent HIV infection. This is called preexposure prophylaxis (PrEP). You are considered at risk if:  You are a heterosexual woman, are sexually active, and are at increased risk for HIV infection.  You take drugs by injection.  You are sexually active with a partner who has HIV.  Talk with your health care provider about whether you are at high risk of being infected with HIV. If you choose to begin PrEP, you should first be tested for HIV. You should then be tested every 3 months for as long as you are taking PrEP.  Osteoporosis is a disease in which the bones lose minerals and strength with aging. This can result in serious bone fractures or breaks. The risk of osteoporosis can be identified using a bone density scan. Women ages 65 years and over and women at risk for fractures or osteoporosis should discuss screening with their health care providers. Ask your health care provider whether you should take a calcium supplement or vitamin D to reduce the rate of osteoporosis.  Menopause can be associated with physical symptoms and risks. Hormone replacement therapy is available to decrease symptoms and risks. You should talk to your health care provider about whether hormone replacement therapy is right for you.  Use sunscreen. Apply sunscreen liberally and repeatedly throughout the day. You should seek shade when your shadow is shorter than you. Protect yourself by wearing long sleeves, pants, a wide-brimmed hat, and sunglasses year round, whenever you are outdoors.  Once a month, do a whole body skin exam, using a mirror to look at the skin on your back. Tell your health care provider of new moles, moles that have irregular borders, moles that are larger than a pencil eraser, or moles that have changed in shape or  color.  Stay current with required vaccines (immunizations).  Influenza vaccine. All adults should be immunized every year.  Tetanus, diphtheria, and acellular pertussis (Td, Tdap) vaccine. Pregnant women should receive  1 dose of Tdap vaccine during each pregnancy. The dose should be obtained regardless of the length of time since the last dose. Immunization is preferred during the 27th-36th week of gestation. An adult who has not previously received Tdap or who does not know her vaccine status should receive 1 dose of Tdap. This initial dose should be followed by tetanus and diphtheria toxoids (Td) booster doses every 10 years. Adults with an unknown or incomplete history of completing a 3-dose immunization series with Td-containing vaccines should begin or complete a primary immunization series including a Tdap dose. Adults should receive a Td booster every 10 years.  Zoster vaccine. One dose is recommended for adults aged 48 years or older unless certain conditions are present.  Pneumococcal 13-valent conjugate (PCV13) vaccine. When indicated, a person who is uncertain of her immunization history and has no record of immunization should receive the PCV13 vaccine. An adult aged 26 years or older who has certain medical conditions and has not been previously immunized should receive 1 dose of PCV13 vaccine. This PCV13 should be followed with a dose of pneumococcal polysaccharide (PPSV23) vaccine. The PPSV23 vaccine dose should be obtained at least 8 weeks after the dose of PCV13 vaccine. An adult aged 60 years or older who has certain medical conditions and previously received 1 or more doses of PPSV23 vaccine should receive 1 dose of PCV13. The PCV13 vaccine dose should be obtained 1 or more years after the last PPSV23 vaccine dose.  Pneumococcal polysaccharide (PPSV23) vaccine. When PCV13 is also indicated, PCV13 should be obtained first. All adults aged 56 years and older should be immunized. An  adult younger than age 68 years who has certain medical conditions should be immunized. Any person who resides in a nursing home or long-term care facility should be immunized. An adult smoker should be immunized. People with an immunocompromised condition and certain other conditions should receive both PCV13 and PPSV23 vaccines. People with human immunodeficiency virus (HIV) infection should be immunized as soon as possible after diagnosis. Immunization during chemotherapy or radiation therapy should be avoided. Routine use of PPSV23 vaccine is not recommended for American Indians, [place] Natives, or people younger than 65 years unless there are medical conditions that require PPSV23 vaccine. When indicated, people who have unknown immunization and have no record of immunization should receive PPSV23 vaccine. One-time revaccination 5 years after the first dose of PPSV23 is recommended for people aged 19-64 years who have chronic kidney failure, nephrotic syndrome, asplenia, or immunocompromised conditions. People who received 1-2 doses of PPSV23 before age 3 years should receive another dose of PPSV23 vaccine at age 49 years or later if at least 5 years have passed since the previous dose. Doses of PPSV23 are not needed for people immunized with PPSV23 at or after age 28 years.  Meningococcal vaccine. Adults with asplenia or persistent complement component deficiencies should receive 2 doses of quadrivalent meningococcal conjugate (MenACWY-D) vaccine. The doses should be obtained at least 2 months apart. Microbiologists working with certain meningococcal bacteria, [place] recruits, people at risk during an outbreak, and people who travel to or live in countries with a high rate of meningitis should be immunized. A first-year college student up through age 40 years who is living in a residence hall should receive a dose if she did not receive a dose on or after her 16th birthday. Adults who have certain  high-risk conditions should receive one or more doses of vaccine.  Hepatitis A vaccine. Adults who  wish to be protected from this disease, have certain high-risk conditions, work with hepatitis A-infected animals, work in hepatitis A research labs, or travel to or work in countries with a high rate of hepatitis A should be immunized. Adults who were previously unvaccinated and who anticipate close contact with an international adoptee during the first 60 days after arrival in the [place] from a country with a high rate of hepatitis A should be immunized.  Hepatitis B vaccine. Adults who wish to be protected from this disease, have certain high-risk conditions, may be exposed to blood or other infectious body fluids, are household contacts or sex partners of hepatitis B positive people, are clients or workers in certain care facilities, or travel to or work in countries with a high rate of hepatitis B should be immunized.  Haemophilus influenzae type b (Hib) vaccine. A previously unvaccinated person with asplenia or sickle cell disease or having a scheduled splenectomy should receive 1 dose of Hib vaccine. Regardless of previous immunization, a recipient of a hematopoietic stem cell transplant should receive a 3-dose series 6-12 months after her successful transplant. Hib vaccine is not recommended for adults with HIV infection. Preventive Services / Frequency  Ages 7 to 79 years  Blood pressure check.  Lipid and cholesterol check.  Clinical breast exam.** / Every 3 years for women in their 10s and 6s.  BRCA-related cancer risk assessment.** / For women who have family members with a BRCA-related cancer (breast, ovarian, tubal, or peritoneal cancers).  Pap test.** / Every 2 years from ages 81 through 12. Every 3 years starting at age 58 through age 31 or 41 with a history of 3 consecutive normal Pap tests.  HPV screening.** / Every 3 years from ages 3 through ages 68 to 38 with a history  of 3 consecutive normal Pap tests.  Hepatitis C blood test.** / For any individual with known risks for hepatitis C.  Skin self-exam. / Monthly.  Influenza vaccine. / Every year.  Tetanus, diphtheria, and acellular pertussis (Tdap, Td) vaccine.** / Consult your health care provider. Pregnant women should receive 1 dose of Tdap vaccine during each pregnancy. 1 dose of Td every 10 years.  Pneumococcal 13-valent conjugate (PCV13) vaccine.** / Consult your health care provider.  Pneumococcal polysaccharide (PPSV23) vaccine.** / 1 to 2 doses if you smoke cigarettes or if you have certain conditions.    Hepatitis A vaccine.** / Consult your health care provider.  Hepatitis B vaccine.** / Consult your health care provider.

## 2014-01-24 LAB — HEPATITIS A ANTIBODY, TOTAL: Hep A Total Ab: NONREACTIVE

## 2014-01-24 LAB — IRON AND TIBC
%SAT: 17 % — AB (ref 20–55)
IRON: 67 ug/dL (ref 42–145)
TIBC: 387 ug/dL (ref 250–470)
UIBC: 320 ug/dL (ref 125–400)

## 2014-01-24 LAB — HEPATIC FUNCTION PANEL
ALT: 10 U/L (ref 0–35)
AST: 14 U/L (ref 0–37)
Albumin: 4.1 g/dL (ref 3.5–5.2)
Alkaline Phosphatase: 43 U/L (ref 39–117)
BILIRUBIN TOTAL: 0.8 mg/dL (ref 0.2–1.2)
Bilirubin, Direct: 0.2 mg/dL (ref 0.0–0.3)
Indirect Bilirubin: 0.6 mg/dL (ref 0.2–1.2)
TOTAL PROTEIN: 6.8 g/dL (ref 6.0–8.3)

## 2014-01-24 LAB — URINALYSIS, MICROSCOPIC ONLY
Bacteria, UA: NONE SEEN
Casts: NONE SEEN
Crystals: NONE SEEN
Squamous Epithelial / LPF: NONE SEEN

## 2014-01-24 LAB — BASIC METABOLIC PANEL WITH GFR
BUN: 9 mg/dL (ref 6–23)
CO2: 25 mEq/L (ref 19–32)
Calcium: 9.2 mg/dL (ref 8.4–10.5)
Chloride: 102 mEq/L (ref 96–112)
Creat: 0.81 mg/dL (ref 0.50–1.10)
GFR, Est Non African American: >89 mL/min
Glucose, Bld: 80 mg/dL (ref 70–99)
POTASSIUM: 4 meq/L (ref 3.5–5.3)
Sodium: 136 mEq/L (ref 135–145)

## 2014-01-24 LAB — HEPATITIS C ANTIBODY: HCV AB: NEGATIVE

## 2014-01-24 LAB — LIPID PANEL
CHOLESTEROL: 153 mg/dL (ref 0–200)
HDL: 73 mg/dL (ref >39)
LDL Cholesterol: 70 mg/dL (ref 0–99)
TRIGLYCERIDES: 51 mg/dL (ref <150)
Total CHOL/HDL Ratio: 2.1 Ratio
VLDL: 10 mg/dL (ref 0–40)

## 2014-01-24 LAB — MICROALBUMIN / CREATININE URINE RATIO
CREATININE, URINE: 47.7 mg/dL
MICROALB/CREAT RATIO: 4.2 mg/g (ref 0.0–30.0)
Microalb, Ur: 0.2 mg/dL (ref <2.0)

## 2014-01-24 LAB — VITAMIN B12: Vitamin B-12: 638 pg/mL (ref 211–911)

## 2014-01-24 LAB — MAGNESIUM: Magnesium: 1.8 mg/dL (ref 1.5–2.5)

## 2014-01-24 LAB — TSH: TSH: 0.801 u[IU]/mL (ref 0.350–4.500)

## 2014-01-24 LAB — HEPATITIS B CORE ANTIBODY, TOTAL: Hep B Core Total Ab: NONREACTIVE

## 2014-01-24 LAB — HEPATITIS B SURFACE ANTIBODY,QUALITATIVE: Hep B S Ab: NEGATIVE

## 2014-01-24 LAB — VITAMIN D 25 HYDROXY (VIT D DEFICIENCY, FRACTURES): Vit D, 25-Hydroxy: 66 ng/mL (ref 30–89)

## 2014-01-24 LAB — INSULIN, FASTING: INSULIN FASTING, SERUM: 2.1 u[IU]/mL (ref 2.0–19.6)

## 2014-01-25 ENCOUNTER — Encounter: Payer: Self-pay | Admitting: Internal Medicine

## 2014-01-25 ENCOUNTER — Ambulatory Visit: Payer: BC Managed Care – PPO | Admitting: Internal Medicine

## 2014-01-25 VITALS — BP 104/70 | HR 80 | Temp 97.8°F | Resp 18 | Ht 65.5 in | Wt 122.0 lb

## 2014-01-25 DIAGNOSIS — L03114 Cellulitis of left upper limb: Secondary | ICD-10-CM

## 2014-01-25 LAB — HEPATITIS B E ANTIBODY: Hepatitis Be Antibody: NONREACTIVE

## 2014-01-25 MED ORDER — DOXYCYCLINE HYCLATE 100 MG PO CAPS: ORAL_CAPSULE | ORAL | Status: DC

## 2014-01-27 ENCOUNTER — Encounter: Payer: Self-pay | Admitting: Internal Medicine

## 2014-01-27 NOTE — Progress Notes (Signed)
   Subjective:    Patient ID: Danielle [name], female    DOB: 1975-05-18, 38 y.o.   MRN: [id number]  HPI Patient had CPE 2 days ago and had incidental shave excision/cauterization of 2 verruca - one on a finger & the other on the left elbow. Today she noted tender erythema around the excisional site on the left elbow.   Review of Systems Pertinent as above  Objective:   Physical Exam BP 104/70 mmHg  Pulse 80  Temp(Src) 97.8 F (36.6 C) (Temporal)  Resp 18  Ht 5' 5.5" (1.664 m)  Wt 122 lb (55.339 kg)  BMI 19.99 kg/m2  The 4 mm Bx site in the left elbow appeared clean w/o exudate, but there was a 1-1&1/2" halo of tender mild erythema around the Bx site . No signs of lymphangitis.  Assessment & Plan:   1) Cellulitis of Bx site  Patient was give an Rx Doxycycline to take and advised to call if redness not resolve.

## 2014-02-01 ENCOUNTER — Encounter: Payer: Self-pay | Admitting: Internal Medicine

## 2014-02-27 ENCOUNTER — Other Ambulatory Visit (INDEPENDENT_AMBULATORY_CARE_PROVIDER_SITE_OTHER): Payer: BC Managed Care – PPO

## 2014-02-27 DIAGNOSIS — Z1211 Encounter for screening for malignant neoplasm of colon: Secondary | ICD-10-CM

## 2014-02-27 LAB — POC HEMOCCULT BLD/STL (HOME/3-CARD/SCREEN)
Card #2 Fecal Occult Blod, POC: NEGATIVE
FECAL OCCULT BLD: NEGATIVE
FECAL OCCULT BLD: NEGATIVE

## 2015-01-30 ENCOUNTER — Encounter: Payer: Self-pay | Admitting: Internal Medicine

## 2015-02-05 ENCOUNTER — Ambulatory Visit (INDEPENDENT_AMBULATORY_CARE_PROVIDER_SITE_OTHER): Payer: Managed Care, Other (non HMO) | Admitting: Internal Medicine

## 2015-02-05 ENCOUNTER — Encounter: Payer: Self-pay | Admitting: Internal Medicine

## 2015-02-05 VITALS — BP 90/60 | HR 68 | Temp 97.5°F | Resp 16 | Ht 65.5 in | Wt 121.4 lb

## 2015-02-05 DIAGNOSIS — Z23 Encounter for immunization: Secondary | ICD-10-CM

## 2015-02-05 DIAGNOSIS — Z79899 Other long term (current) drug therapy: Secondary | ICD-10-CM

## 2015-02-05 DIAGNOSIS — E7800 Pure hypercholesterolemia, unspecified: Secondary | ICD-10-CM

## 2015-02-05 DIAGNOSIS — IMO0001 Reserved for inherently not codable concepts without codable children: Secondary | ICD-10-CM

## 2015-02-05 DIAGNOSIS — E559 Vitamin D deficiency, unspecified: Secondary | ICD-10-CM

## 2015-02-05 DIAGNOSIS — R030 Elevated blood-pressure reading, without diagnosis of hypertension: Secondary | ICD-10-CM

## 2015-02-05 DIAGNOSIS — R5383 Other fatigue: Secondary | ICD-10-CM

## 2015-02-05 DIAGNOSIS — Z111 Encounter for screening for respiratory tuberculosis: Secondary | ICD-10-CM

## 2015-02-05 DIAGNOSIS — Z0001 Encounter for general adult medical examination with abnormal findings: Secondary | ICD-10-CM

## 2015-02-05 DIAGNOSIS — Z1212 Encounter for screening for malignant neoplasm of rectum: Secondary | ICD-10-CM

## 2015-02-05 DIAGNOSIS — K219 Gastro-esophageal reflux disease without esophagitis: Secondary | ICD-10-CM

## 2015-02-05 DIAGNOSIS — Z0000 Encounter for general adult medical examination without abnormal findings: Secondary | ICD-10-CM

## 2015-02-05 DIAGNOSIS — R7309 Other abnormal glucose: Secondary | ICD-10-CM

## 2015-02-05 DIAGNOSIS — Z6820 Body mass index (BMI) 20.0-20.9, adult: Secondary | ICD-10-CM

## 2015-02-05 LAB — HEPATIC FUNCTION PANEL
ALBUMIN: 4.2 g/dL (ref 3.6–5.1)
ALT: 15 U/L (ref 6–29)
AST: 19 U/L (ref 10–30)
Alkaline Phosphatase: 45 U/L (ref 33–115)
BILIRUBIN DIRECT: 0.2 mg/dL (ref <=0.2)
Indirect Bilirubin: 0.8 mg/dL (ref 0.2–1.2)
Total Bilirubin: 1 mg/dL (ref 0.2–1.2)
Total Protein: 6.8 g/dL (ref 6.1–8.1)

## 2015-02-05 LAB — CBC WITH DIFFERENTIAL/PLATELET
BASOS ABS: 0 10*3/uL (ref 0.0–0.1)
BASOS PCT: 0 % (ref 0–1)
Eosinophils Absolute: 0 10*3/uL (ref 0.0–0.7)
Eosinophils Relative: 1 % (ref 0–5)
HEMATOCRIT: 40.9 % (ref 36.0–46.0)
Hemoglobin: 13.7 g/dL (ref 12.0–15.0)
LYMPHS PCT: 28 % (ref 12–46)
Lymphs Abs: 1.3 10*3/uL (ref 0.7–4.0)
MCH: 32 pg (ref 26.0–34.0)
MCHC: 33.5 g/dL (ref 30.0–36.0)
MCV: 95.6 fL (ref 78.0–100.0)
MPV: 11.5 fL (ref 8.6–12.4)
Monocytes Absolute: 0.3 10*3/uL (ref 0.1–1.0)
Monocytes Relative: 7 % (ref 3–12)
NEUTROS ABS: 3 10*3/uL (ref 1.7–7.7)
NEUTROS PCT: 64 % (ref 43–77)
Platelets: 195 10*3/uL (ref 150–400)
RBC: 4.28 MIL/uL (ref 3.87–5.11)
RDW: 12.1 % (ref 11.5–15.5)
WBC: 4.7 10*3/uL (ref 4.0–10.5)

## 2015-02-05 LAB — MAGNESIUM: Magnesium: 1.8 mg/dL (ref 1.5–2.5)

## 2015-02-05 LAB — HEMOGLOBIN A1C
Hgb A1c MFr Bld: 5.4 % (ref <5.7)
MEAN PLASMA GLUCOSE: 108 mg/dL (ref <117)

## 2015-02-05 LAB — VITAMIN B12: Vitamin B-12: 561 pg/mL (ref 211–911)

## 2015-02-05 LAB — LIPID PANEL
Cholesterol: 176 mg/dL (ref 125–200)
HDL: 78 mg/dL (ref >=46)
LDL CALC: 82 mg/dL (ref <130)
TRIGLYCERIDES: 79 mg/dL (ref <150)
Total CHOL/HDL Ratio: 2.3 Ratio (ref <=5.0)
VLDL: 16 mg/dL (ref <30)

## 2015-02-05 LAB — TSH: TSH: 1.14 u[IU]/mL (ref 0.350–4.500)

## 2015-02-05 LAB — IRON AND TIBC
%SAT: 28 % (ref 11–50)
Iron: 120 ug/dL (ref 40–190)
TIBC: 436 ug/dL (ref 250–450)
UIBC: 316 ug/dL (ref 125–400)

## 2015-02-05 LAB — BASIC METABOLIC PANEL WITH GFR
BUN: 15 mg/dL (ref 7–25)
CO2: 25 mmol/L (ref 20–31)
Calcium: 9.2 mg/dL (ref 8.6–10.2)
Chloride: 102 mmol/L (ref 98–110)
Creat: 0.88 mg/dL (ref 0.50–1.10)
GFR, EST NON AFRICAN AMERICAN: 82 mL/min (ref >=60)
Glucose, Bld: 71 mg/dL (ref 65–99)
POTASSIUM: 3.9 mmol/L (ref 3.5–5.3)
Sodium: 137 mmol/L (ref 135–146)

## 2015-02-05 NOTE — Patient Instructions (Signed)
Recommend Adult Low Dose Aspirin or   coated  Aspirin 81 mg daily   To reduce risk of Colon Cancer 20 %,   Skin Cancer 26 % ,   Melanoma 46%   and   Pancreatic cancer 60%   ++++++++++++++++++++++++++++++++++++++++++++++++++++++  Vitamin D goal   is between 70-100.   Please make sure that you are taking your Vitamin D as directed.   It is very important as a natural anti-inflammatory   helping hair, skin, and nails, as well as reducing stroke and heart attack risk.   It helps your bones and helps with mood.  It also decreases numerous cancer risks so please take it as directed.   Low Vit D is associated with a 200-300% higher risk for CANCER   and 200-300% higher risk for HEART   ATTACK  &  STROKE.   ......................................  It is also associated with higher death rate at younger ages,   autoimmune diseases like Rheumatoid arthritis, Lupus, Multiple Sclerosis.     Also many other serious conditions, like depression, Alzheimer's  Dementia, infertility, muscle aches, fatigue, fibromyalgia - just to name a few.  ++++++++++++++++++++++++++++++++++++++++++++++++  Recommend the book "The END of DIETING" by Dr Joel Fuhrman   & the book "The END of DIABETES " by Dr Joel Fuhrman  At [employer] - get book & Audio CD's     Being diabetic has a  300% increased risk for heart attack, stroke, cancer, and alzheimer- type vascular dementia. It is very important that you work harder with diet by avoiding all foods that are white. Avoid white rice (brown & wild rice is OK), white potatoes (sweetpotatoes in moderation is OK), White bread or wheat bread or anything made out of white flour like bagels, donuts, rolls, buns, biscuits, cakes, pastries, cookies, pizza crust, and pasta (made from white flour & egg whites) - vegetarian pasta or spinach or wheat pasta is OK. Multigrain breads like Arnold's or [employer], or multigrain sandwich thins or flatbreads.  Diet,  exercise and weight loss can reverse and cure diabetes in the early stages.  Diet, exercise and weight loss is very important in the control and prevention of complications of diabetes which affects every system in your body, ie. Brain - dementia/stroke, eyes - glaucoma/blindness, heart - heart attack/heart failure, kidneys - dialysis, stomach - gastric paralysis, intestines - malabsorption, nerves - severe painful neuritis, circulation - gangrene & loss of a leg(s), and finally cancer and Alzheimers.    I recommend avoid fried & greasy foods,  sweets/candy, white rice (brown or wild rice or Quinoa is OK), white potatoes (sweet potatoes are OK) - anything made from white flour - bagels, doughnuts, rolls, buns, biscuits,white and wheat breads, pizza crust and traditional pasta made of white flour & egg white(vegetarian pasta or spinach or wheat pasta is OK).  Multi-grain bread is OK - like multi-grain flat bread or sandwich thins. Avoid alcohol in excess. Exercise is also important.    Eat all the vegetables you want - avoid meat, especially red meat and dairy - especially cheese.  Cheese is the most concentrated form of trans-fats which is the worst thing to clog up our arteries. Veggie cheese is OK which can be found in the fresh produce section at [employer] or [employer] or [employer]  ++++++++++++++++++++++++++++++++++++++++++++++++++ DASH Eating Plan  DASH stands for "Dietary Approaches to Stop Hypertension."   The DASH eating plan is a healthy eating plan that has been shown to reduce high   blood pressure (hypertension). Additional health benefits may include reducing the risk of type 2 diabetes mellitus, heart disease, and stroke. The DASH eating plan may also help with weight loss.  WHAT DO I NEED TO KNOW ABOUT THE DASH EATING PLAN? For the DASH eating plan, you will follow these general guidelines:  Choose foods with a percent daily value for sodium of less than 5% (as listed on the food  label).  Use salt-free seasonings or herbs instead of table salt or sea salt.  Check with your health care provider or pharmacist before using salt substitutes.  Eat lower-sodium products, often labeled as "lower sodium" or "no salt added."  Eat fresh foods.  Eat more vegetables, fruits, and low-fat dairy products.    Choose whole grains. Look for the word "whole" as the first word in the ingredient list.  Choose fish   Limit sweets, desserts, sugars, and sugary drinks.  Choose heart-healthy fats.  Eat veggie cheese   Eat more home-cooked food and less restaurant, buffet, and fast food.  Limit fried foods.  Cook foods using methods other than frying.  Limit canned vegetables. If you do use them, rinse them well to decrease the sodium.  When eating at a restaurant, ask that your food be prepared with less salt, or no salt if possible.                      WHAT FOODS CAN I EAT?  Seek help from a dietitian for individual calorie needs.  Grains Whole grain or whole wheat bread. Brown rice. Whole grain or whole wheat pasta. Quinoa, bulgur, and whole grain cereals. Low-sodium cereals. Corn or whole wheat flour tortillas. Whole grain cornbread. Whole grain crackers. Low-sodium crackers.  Vegetables Fresh or frozen vegetables (raw, steamed, roasted, or grilled). Low-sodium or reduced-sodium tomato and vegetable juices. Low-sodium or reduced-sodium tomato sauce and paste. Low-sodium or reduced-sodium canned vegetables.   Fruits All fresh, canned (in natural juice), or frozen fruits.  Protein Products  All fish and seafood.  Dried beans, peas, or lentils. Unsalted nuts and seeds. Unsalted canned beans.  Dairy Low-fat dairy products, such as skim or 1% milk, 2% or reduced-fat cheeses, low-fat ricotta or cottage cheese, or plain low-fat yogurt. Low-sodium or reduced-sodium cheeses.  Fats and Oils Tub margarines without trans fats. Light or reduced-fat mayonnaise and salad  dressings (reduced sodium). Avocado. Safflower, olive, or canola oils. Natural peanut or almond butter.  Other Unsalted popcorn and pretzels. The items listed above may not be a complete list of recommended foods or beverages. Contact your dietitian for more options.  +++++++++++++++++++++++++++++++++++++++++++  WHAT FOODS ARE NOT RECOMMENDED?  Grains/ White flour or wheat flour  White bread. White pasta. White rice. Refined cornbread. Bagels and croissants. Crackers that contain trans fat.  Vegetables  Creamed or fried vegetables. Vegetables in a . Regular canned vegetables. Regular canned tomato sauce and paste. Regular tomato and vegetable juices.  Fruits Dried fruits. Canned fruit in light or heavy syrup. Fruit juice.  Meat and Other Protein Products Meat in general. Fatty cuts of meat. Ribs, chicken wings, bacon, sausage, bologna, salami, chitterlings, fatback, hot dogs, bratwurst, and packaged luncheon meats.  Dairy Whole or 2% milk, cream, half-and-half, and cream cheese. Whole-fat or sweetened yogurt. Full-fat cheeses or blue cheese. Nondairy creamers and whipped toppings. Processed cheese, cheese spreads, or cheese curds.  Condiments Onion and garlic salt, seasoned salt, table salt, and sea salt. Canned and packaged gravies. Worcestershire sauce. Tartar sauce.  Barbecue sauce. Teriyaki sauce. Soy sauce, including reduced sodium. Steak sauce. Fish sauce. Oyster sauce. Cocktail sauce. Horseradish. Ketchup and mustard. Meat flavorings and tenderizers. Bouillon cubes. Hot sauce. Tabasco sauce. Marinades. Taco seasonings. Relishes.  Fats and Oils Butter, stick margarine, lard, shortening, ghee, and bacon fat. Coconut, palm kernel, or palm oils. Regular salad dressings.  Pickles and olives. Salted popcorn and pretzels. The items listed above may not be a complete list of foods and beverages to avoid.   Preventive Care for Adults  A healthy lifestyle and preventive care can  promote health and wellness. Preventive health guidelines for women include the following key practices.  A routine yearly physical is a good way to check with your health care provider about your health and preventive screening. It is a chance to share any concerns and updates on your health and to receive a thorough exam.  Visit your dentist for a routine exam and preventive care every 6 months. Brush your teeth twice a day and floss once a day. Good oral hygiene prevents tooth decay and gum disease.  The frequency of eye exams is based on your age, health, family medical history, use of contact lenses, and other factors. Follow your health care provider's recommendations for frequency of eye exams.  Eat a healthy diet. Foods like vegetables, fruits, whole grains, low-fat dairy products, and lean protein foods contain the nutrients you need without too many calories. Decrease your intake of foods high in solid fats, added sugars, and salt. Eat the right amount of calories for you.Get information about a proper diet from your health care provider, if necessary.  Regular physical exercise is one of the most important things you can do for your health. Most adults should get at least 150 minutes of moderate-intensity exercise (any activity that increases your heart rate and causes you to sweat) each week. In addition, most adults need muscle-strengthening exercises on 2 or more days a week.  Maintain a healthy weight. The body mass index (BMI) is a screening tool to identify possible weight problems. It provides an estimate of body fat based on height and weight. Your health care provider can find your BMI and can help you achieve or maintain a healthy weight.For adults 20 years and older:  A BMI below 18.5 is considered underweight.  A BMI of 18.5 to 24.9 is normal.  A BMI of 25 to 29.9 is considered overweight.  A BMI of 30 and above is considered obese.  Maintain normal blood lipids and  cholesterol levels by exercising and minimizing your intake of saturated fat. Eat a balanced diet with plenty of fruit and vegetables. Blood tests for lipids and cholesterol should begin at age 31 and be repeated every 5 years. If your lipid or cholesterol levels are high, you are over 50, or you are at high risk for heart disease, you may need your cholesterol levels checked more frequently.Ongoing high lipid and cholesterol levels should be treated with medicines if diet and exercise are not working.  If you smoke, find out from your health care provider how to quit. If you do not use tobacco, do not start.  Lung cancer screening is recommended for adults aged 83-80 years who are at high risk for developing lung cancer because of a history of smoking. A yearly low-dose CT scan of the lungs is recommended for people who have at least a 30-pack-year history of smoking and are a current smoker or have quit within the past 15 years.  A pack year of smoking is smoking an average of 1 pack of cigarettes a day for 1 year (for example: 1 pack a day for 30 years or 2 packs a day for 15 years). Yearly screening should continue until the smoker has stopped smoking for at least 15 years. Yearly screening should be stopped for people who develop a health problem that would prevent them from having lung cancer treatment.  High blood pressure causes heart disease and increases the risk of stroke. Your blood pressure should be checked at least every 1 to 2 years. Ongoing high blood pressure should be treated with medicines if weight loss and exercise do not work.  If you are 61-30 years old, ask your health care provider if you should take aspirin to prevent strokes.  Diabetes screening involves taking a blood sample to check your fasting blood sugar level. This should be done once every 3 years, after age 9, if you are within normal weight and without risk factors for diabetes. Testing should be considered at a  younger age or be carried out more frequently if you are overweight and have at least 1 risk factor for diabetes.  Breast cancer screening is essential preventive care for women. You should practice "breast self-awareness." This means understanding the normal appearance and feel of your breasts and may include breast self-examination. Any changes detected, no matter how small, should be reported to a health care provider. Women in their 74s and 30s should have a clinical breast exam (CBE) by a health care provider as part of a regular health exam every 1 to 3 years. After age 75, women should have a CBE every year. Starting at age 19, women should consider having a mammogram (breast X-ray test) every year. Women who have a family history of breast cancer should talk to their health care provider about genetic screening. Women at a high risk of breast cancer should talk to their health care providers about having an MRI and a mammogram every year.  Breast cancer gene (BRCA)-related cancer risk assessment is recommended for women who have family members with BRCA-related cancers. BRCA-related cancers include breast, ovarian, tubal, and peritoneal cancers. Having family members with these cancers may be associated with an increased risk for harmful changes (mutations) in the breast cancer genes BRCA1 and BRCA2. Results of the assessment will determine the need for genetic counseling and BRCA1 and BRCA2 testing.  Routine pelvic exams to screen for cancer are no longer recommended for nonpregnant women who are considered low risk for cancer of the pelvic organs (ovaries, uterus, and vagina) and who do not have symptoms. Ask your health care provider if a screening pelvic exam is right for you.  If you have had past treatment for cervical cancer or a condition that could lead to cancer, you need Pap tests and screening for cancer for at least 20 years after your treatment. If Pap tests have been discontinued, your  risk factors (such as having a new sexual partner) need to be reassessed to determine if screening should be resumed. Some women have medical problems that increase the chance of getting cervical cancer. In these cases, your health care provider may recommend more frequent screening and Pap tests.  Colorectal cancer can be detected and often prevented. Most routine colorectal cancer screening begins at the age of 23 years and continues through age 42 years. However, your health care provider may recommend screening at an earlier age if you have risk factors for colon  cancer. On a yearly basis, your health care provider may provide home test kits to check for hidden blood in the stool. Use of a small camera at the end of a tube, to directly examine the colon (sigmoidoscopy or colonoscopy), can detect the earliest forms of colorectal cancer. Talk to your health care provider about this at age 50, when routine screening begins. Direct exam of the colon should be repeated every 5-10 years through age 75 years, unless early forms of pre-cancerous polyps or small growths are found.  Hepatitis C blood testing is recommended for all people born from 1945 through 1965 and any individual with known risks for hepatitis C.  Pra  Osteoporosis is a disease in which the bones lose minerals and strength with aging. This can result in serious bone fractures or breaks. The risk of osteoporosis can be identified using a bone density scan. Women ages 65 years and over and women at risk for fractures or osteoporosis should discuss screening with their health care providers. Ask your health care provider whether you should take a calcium supplement or vitamin D to reduce the rate of osteoporosis.  Menopause can be associated with physical symptoms and risks. Hormone replacement therapy is available to decrease symptoms and risks. You should talk to your health care provider about whether hormone replacement therapy is right  for you.  Use sunscreen. Apply sunscreen liberally and repeatedly throughout the day. You should seek shade when your shadow is shorter than you. Protect yourself by wearing long sleeves, pants, a wide-brimmed hat, and sunglasses year round, whenever you are outdoors.  Once a month, do a whole body skin exam, using a mirror to look at the skin on your back. Tell your health care provider of new moles, moles that have irregular borders, moles that are larger than a pencil eraser, or moles that have changed in shape or color.  Stay current with required vaccines (immunizations).  Influenza vaccine. All adults should be immunized every year.  Tetanus, diphtheria, and acellular pertussis (Td, Tdap) vaccine. Pregnant women should receive 1 dose of Tdap vaccine during each pregnancy. The dose should be obtained regardless of the length of time since the last dose. Immunization is preferred during the 27th-36th week of gestation. An adult who has not previously received Tdap or who does not know her vaccine status should receive 1 dose of Tdap. This initial dose should be followed by tetanus and diphtheria toxoids (Td) booster doses every 10 years. Adults with an unknown or incomplete history of completing a 3-dose immunization series with Td-containing vaccines should begin or complete a primary immunization series including a Tdap dose. Adults should receive a Td booster every 10 years.  Varicella vaccine. An adult without evidence of immunity to varicella should receive 2 doses or a second dose if she has previously received 1 dose. Pregnant females who do not have evidence of immunity should receive the first dose after pregnancy. This first dose should be obtained before leaving the health care facility. The second dose should be obtained 4-8 weeks after the first dose.  Human papillomavirus (HPV) vaccine. Females aged 13-26 years who have not received the vaccine previously should obtain the 3-dose  series. The vaccine is not recommended for use in pregnant females. However, pregnancy testing is not needed before receiving a dose. If a female is found to be pregnant after receiving a dose, no treatment is needed. In that case, the remaining doses should be delayed until after the pregnancy. Immunization   is recommended for any person with an immunocompromised condition through the age of 26 years if she did not get any or all doses earlier. During the 3-dose series, the second dose should be obtained 4-8 weeks after the first dose. The third dose should be obtained 24 weeks after the first dose and 16 weeks after the second dose.  Zoster vaccine. One dose is recommended for adults aged 60 years or older unless certain conditions are present.  Measles, mumps, and rubella (MMR) vaccine. Adults born before 1957 generally are considered immune to measles and mumps. Adults born in 1957 or later should have 1 or more doses of MMR vaccine unless there is a contraindication to the vaccine or there is laboratory evidence of immunity to each of the three diseases. A routine second dose of MMR vaccine should be obtained at least 28 days after the first dose for students attending postsecondary schools, health care workers, or international travelers. People who received inactivated measles vaccine or an unknown type of measles vaccine during 1963-1967 should receive 2 doses of MMR vaccine. People who received inactivated mumps vaccine or an unknown type of mumps vaccine before 1979 and are at high risk for mumps infection should consider immunization with 2 doses of MMR vaccine. For females of childbearing age, rubella immunity should be determined. If there is no evidence of immunity, females who are not pregnant should be vaccinated. If there is no evidence of immunity, females who are pregnant should delay immunization until after pregnancy. Unvaccinated health care workers born before 1957 who lack laboratory  evidence of measles, mumps, or rubella immunity or laboratory confirmation of disease should consider measles and mumps immunization with 2 doses of MMR vaccine or rubella immunization with 1 dose of MMR vaccine.  Pneumococcal 13-valent conjugate (PCV13) vaccine. When indicated, a person who is uncertain of her immunization history and has no record of immunization should receive the PCV13 vaccine. An adult aged 19 years or older who has certain medical conditions and has not been previously immunized should receive 1 dose of PCV13 vaccine. This PCV13 should be followed with a dose of pneumococcal polysaccharide (PPSV23) vaccine. The PPSV23 vaccine dose should be obtained at least 8 weeks after the dose of PCV13 vaccine. An adult aged 19 years or older who has certain medical conditions and previously received 1 or more doses of PPSV23 vaccine should receive 1 dose of PCV13. The PCV13 vaccine dose should be obtained 1 or more years after the last PPSV23 vaccine dose.    Pneumococcal polysaccharide (PPSV23) vaccine. When PCV13 is also indicated, PCV13 should be obtained first. All adults aged 65 years and older should be immunized. An adult younger than age 65 years who has certain medical conditions should be immunized. Any person who resides in a nursing home or long-term care facility should be immunized. An adult smoker should be immunized. People with an immunocompromised condition and certain other conditions should receive both PCV13 and PPSV23 vaccines. People with human immunodeficiency virus (HIV) infection should be immunized as soon as possible after diagnosis. Immunization during chemotherapy or radiation therapy should be avoided. Routine use of PPSV23 vaccine is not recommended for American Indians, [place] Natives, or people younger than 65 years unless there are medical conditions that require PPSV23 vaccine. When indicated, people who have unknown immunization and have no record of immunization  should receive PPSV23 vaccine. One-time revaccination 5 years after the first dose of PPSV23 is recommended for people aged 19-64 years who have   chronic kidney failure, nephrotic syndrome, asplenia, or immunocompromised conditions. People who received 1-2 doses of PPSV23 before age 65 years should receive another dose of PPSV23 vaccine at age 65 years or later if at least 5 years have passed since the previous dose. Doses of PPSV23 are not needed for people immunized with PPSV23 at or after age 65 years.  Preventive Services / Frequency   Ages 40 to 64 years  Blood pressure check.  Lipid and cholesterol check.  Lung cancer screening. / Every year if you are aged 55-80 years and have a 30-pack-year history of smoking and currently smoke or have quit within the past 15 years. Yearly screening is stopped once you have quit smoking for at least 15 years or develop a health problem that would prevent you from having lung cancer treatment.  Clinical breast exam.** / Every year after age 40 years.  BRCA-related cancer risk assessment.** / For women who have family members with a BRCA-related cancer (breast, ovarian, tubal, or peritoneal cancers).  Mammogram.** / Every year beginning at age 40 years and continuing for as long as you are in good health. Consult with your health care provider.  Pap test.** / Every 3 years starting at age 30 years through age 65 or 70 years with a history of 3 consecutive normal Pap tests.  HPV screening.** / Every 3 years from ages 30 years through ages 65 to 70 years with a history of 3 consecutive normal Pap tests.  Fecal occult blood test (FOBT) of stool. / Every year beginning at age 50 years and continuing until age 75 years. You may not need to do this test if you get a colonoscopy every 10 years.  Flexible sigmoidoscopy or colonoscopy.** / Every 5 years for a flexible sigmoidoscopy or every 10 years for a colonoscopy beginning at age 50 years and continuing  until age 75 years.  Hepatitis C blood test.** / For all people born from 1945 through 1965 and any individual with known risks for hepatitis C.  Skin self-exam. / Monthly.  Influenza vaccine. / Every year.  Tetanus, diphtheria, and acellular pertussis (Tdap/Td) vaccine.** / Consult your health care provider. Pregnant women should receive 1 dose of Tdap vaccine during each pregnancy. 1 dose of Td every 10 years.  Varicella vaccine.** / Consult your health care provider. Pregnant females who do not have evidence of immunity should receive the first dose after pregnancy.  Zoster vaccine.** / 1 dose for adults aged 60 years or older.  Pneumococcal 13-valent conjugate (PCV13) vaccine.** / Consult your health care provider.  Pneumococcal polysaccharide (PPSV23) vaccine.** / 1 to 2 doses if you smoke cigarettes or if you have certain conditions.  Meningococcal vaccine.** / Consult your health care provider.  Hepatitis A vaccine.** / Consult your health care provider.  Hepatitis B vaccine.** / Consult your health care provider. Screening for abdominal aortic aneurysm (AAA)  by ultrasound is recommended for people over 50 who have history of high blood pressure or who are current or former smokers. 

## 2015-02-05 NOTE — Progress Notes (Signed)
Patient ID: Danielle [name], female   DOB: 05/03/1975, 39 y.o.   MRN: [id number] Annual Screening/Preventative Visit & Comprehensive Examination     This very nice 39 y.o. MWF  presents for presents for a Wellness/Preventative Visit & comprehensive evaluation and management of multiple medical co-morbidities.  Patient has been followed expectantly for elevated BP,   Prediabetes, Hyperlipidemia, and Vitamin D Deficiency.      As above , patient has been followed proactively and expectantly monitored for elevated BP and patient's BP has been controlled at home and patient denies any cardiac symptoms as chest pain, palpitations, shortness of breath, dizziness or ankle swelling. Today's BP: 90/60 mmHg      Patient has hx/o dyslipidemia with elevated Triglycerides in the past and is controlled with diet. Last lipids were at goal with  Cholesterol 176; HDL 78; LDL 82; Triglycerides 79 on 02/05/2015.     Patient is monitored also for glucose intolerance & insulin resistance prediabetes and patient has judiciously keep her weight down  and patient denies reactive hypoglycemic symptoms, visual blurring, diabetic polys, or paresthesias. Last A1c was 5.2% in Nov 2015.     Patient does have hx/o GERD and attempts to control sx's with diet, but occasionally takes OTC Ranitidine 150 as rescue of dietary indiscretions. She does further c/o what she feels is excessive fatigue w/o any constitutional sx's otherwise.  Finally, patient has history of Vitamin D Deficiency and last Vitamin D was 55 in Nov 2015.   Medication Sig  . aspirin 81 MG tablet Take 81 mg by mouth daily.  . Chlorpheniramine Maleate (ALLERGY RELIEF PO) Take by mouth.  . cholecalciferol 2000 UNITS tablet Take 4,000 Units  daily.  Marland Kitchen OVER THE COUNTER MEDICATION Iron 65 mg tab 1 daily  . OVER THE COUNTER MEDICATION Tumeric 1 daily  . Probiotic Product (PROBIOTIC DAILY PO) Take by mouth.   Allergies  Allergen Reactions  . Sulfa Antibiotics   .  Levaquin [Levofloxacin In D5w] Rash   Past Medical History  Diagnosis Date  . GERD (gastroesophageal reflux disease)   . Vitamin D deficiency   . UTI (lower urinary tract infection)   . Shingles 2011  . Parvovirus infection 2013   Health Maintenance  Topic Date Due  . PAP SMEAR  04/20/1995  . INFLUENZA VACCINE  10/21/2014  . TETANUS/TDAP  01/30/2016  . HIV Screening  Completed   Immunization History  Administered Date(s) Administered  . Influenza Whole 12/30/2011  . Influenza, Seasonal, Injecte, Preservative Fre 02/05/2015  . Influenza-Unspecified 01/21/2014  . PPD Test 01/23/2013, 01/23/2014, 02/05/2015  . Pneumococcal Conjugate-13 12/30/2011  . Td 01/29/2006   Past Surgical History  Procedure Laterality Date  . Esophagogastroduodenoscopy endoscopy  2012   Family History  Problem Relation Age of Onset  . Cancer Maternal Grandmother     breast  . Cancer Maternal Grandfather     lung  . Cancer Paternal Grandfather     prostate  . Mitral valve prolapse Mother    Social History  Substance Use Topics  . Smoking status: Never Smoker   . Smokeless tobacco: None  . Alcohol Use: No     Comment: rarely    ROS Constitutional: Denies fever, chills, weight loss/gain, headaches, insomnia,  night sweats, and change in appetite. Does c/o fatigue. Eyes: Denies redness, blurred vision, diplopia, discharge, itchy, watery eyes.  ENT: Denies discharge, congestion, post nasal drip, epistaxis, sore throat, earache, hearing loss, dental pain, Tinnitus, Vertigo, Sinus pain, snoring.  Cardio: Denies chest  pain, palpitations, irregular heartbeat, syncope, dyspnea, diaphoresis, orthopnea, PND, claudication, edema Respiratory: denies cough, dyspnea, DOE, pleurisy, hoarseness, laryngitis, wheezing.  Gastrointestinal: Denies dysphagia, heartburn, reflux, water brash, pain, cramps, nausea, vomiting, bloating, diarrhea, constipation, hematemesis, melena, hematochezia, jaundice,  hemorrhoids Genitourinary: Denies dysuria, frequency, urgency, nocturia, hesitancy, discharge, hematuria, flank pain Breast: Breast lumps, nipple discharge, bleeding.  Musculoskeletal: Denies arthralgia, myalgia, stiffness, Jt. Swelling, pain, limp, and strain/sprain. Denies falls. Skin: Denies puritis, rash, hives, warts, acne, eczema, changing in skin lesion Neuro: No weakness, tremor, incoordination, spasms, paresthesia, pain Psychiatric: Denies confusion, memory loss, sensory loss. Denies Depression. Endocrine: Denies change in weight, skin, hair change, nocturia, and paresthesia, diabetic polys, visual blurring, hyper / hypo glycemic episodes.  Heme/Lymph: No excessive bleeding, bruising, enlarged lymph nodes.  Physical Exam  BP 90/60 mmHg  Pulse 68  Temp(Src) 97.5 F (36.4 C)  Resp 16  Ht 5' 5.5" (1.664 m)  Wt 121 lb 6.4 oz (55.067 kg)  BMI 19.89 kg/m2  General Appearance: Well nourished and in no apparent distress. Eyes: PERRLA, EOMs, conjunctiva no swelling or erythema, normal fundi and vessels. Sinuses: No frontal/maxillary tenderness ENT/Mouth: EACs patent / TMs  nl. Nares clear without erythema, swelling, mucoid exudates. Oral hygiene is good. No erythema, swelling, or exudate. Tongue normal, non-obstructing. Tonsils not swollen or erythematous. Hearing normal.  Neck: Supple, thyroid normal. No bruits, nodes or JVD. Respiratory: Respiratory effort normal.  BS equal and clear bilateral without rales, rhonci, wheezing or stridor. Cardio: Heart sounds are normal with regular rate and rhythm and no murmurs, rubs or gallops. Peripheral pulses are normal and equal bilaterally without edema. No aortic or femoral bruits. Chest: symmetric with normal excursions and percussion. Breasts: Symmetric, without lumps, nipple discharge, retractions, or fibrocystic changes.  Abdomen: Flat, soft, with bowl sounds. Nontender, no guarding, rebound, hernias, masses, or organomegaly.  Lymphatics:  Non tender without lymphadenopathy.  Genitourinary:  Musculoskeletal: Full ROM all peripheral extremities, joint stability, 5/5 strength, and normal gait. Skin: Warm and dry without rashes, lesions, cyanosis, clubbing or  ecchymosis.  Neuro: Cranial nerves intact, reflexes equal bilaterally. Normal muscle tone, no cerebellar symptoms. Sensation intact.  Pysch: Awake and oriented X 3, normal affect, Insight and Judgment appropriate.   Assessment and Plan  1. Annual Preventative Screening Examination  - EKG 12-Lead - POC Hemoccult Bld/Stl (3-Cd Home Screen); Future - Vitamin B12 - Iron and TIBC - Urinalysis, Routine w reflex microscopic  - CBC with Differential/Platelet - BASIC METABOLIC PANEL WITH GFR - Hepatic function panel - Magnesium - Lipid panel - TSH - Hemoglobin A1c - Insulin, random - VITAMIN D 25 Hydroxy   2. Elevated BP  - EKG 12-Lead  3. Elevated cholesterol  - Lipid panel  4. Other abnormal glucose  - Hemoglobin A1c - Insulin, random  5. Vitamin D deficiency  - VITAMIN D 25 Hydroxy (Vit-D Deficiency, Fractures)  6. Gastroesophageal reflux disease, esophagitis presence not specified   7. Body mass index (BMI) of 20.0-20.9 in adult   8. Screening for rectal cancer  - POC Hemoccult Bld/Stl (3-Cd Home Screen); Future  9. Other fatigue  - Vitamin B12 - Iron and TIBC - TSH  10. Need for prophylactic vaccination and inoculation against influenza  - Flu vaccine greater than or equal to 3yo preservative free IM  11. Screening examination for pulmonary tuberculosis  - PPD  12. Medication management  - Urinalysis, Routine w reflex microscopic (not at [facility]) - CBC with Differential/Platelet - BASIC METABOLIC PANEL WITH GFR - Hepatic function  panel - Magnesium     Continue prudent diet as discussed, weight control, BP monitoring, regular exercise, and medications. Discussed med's effects and SE's. Screening labs and tests as requested with  regular follow-up as recommended.

## 2015-02-06 ENCOUNTER — Other Ambulatory Visit: Payer: Self-pay | Admitting: Internal Medicine

## 2015-02-06 LAB — URINALYSIS, ROUTINE W REFLEX MICROSCOPIC
Bilirubin Urine: NEGATIVE
Glucose, UA: NEGATIVE
Ketones, ur: NEGATIVE
LEUKOCYTES UA: NEGATIVE
Nitrite: NEGATIVE
PROTEIN: NEGATIVE
SPECIFIC GRAVITY, URINE: 1.011 (ref 1.001–1.035)
pH: 5.5 (ref 5.0–8.0)

## 2015-02-06 LAB — URINALYSIS, MICROSCOPIC ONLY
Bacteria, UA: NONE SEEN [HPF]
CASTS: NONE SEEN [LPF]
Crystals: NONE SEEN [HPF]
RBC / HPF: NONE SEEN RBC/HPF (ref <=2)
SQUAMOUS EPITHELIAL / LPF: NONE SEEN [HPF] (ref <=5)
WBC UA: NONE SEEN WBC/HPF (ref <=5)
YEAST: NONE SEEN [HPF]

## 2015-02-06 LAB — INSULIN, RANDOM: Insulin: 4.7 u[IU]/mL (ref 2.0–19.6)

## 2015-02-06 LAB — VITAMIN D 25 HYDROXY (VIT D DEFICIENCY, FRACTURES): Vit D, 25-Hydroxy: 42 ng/mL (ref 30–100)

## 2015-02-07 ENCOUNTER — Encounter: Payer: Self-pay | Admitting: Internal Medicine

## 2015-02-07 LAB — TB SKIN TEST
INDURATION: 0 mm
TB SKIN TEST: NEGATIVE

## 2015-03-03 ENCOUNTER — Other Ambulatory Visit (INDEPENDENT_AMBULATORY_CARE_PROVIDER_SITE_OTHER): Payer: Managed Care, Other (non HMO) | Admitting: *Deleted

## 2015-03-03 DIAGNOSIS — Z1212 Encounter for screening for malignant neoplasm of rectum: Secondary | ICD-10-CM | POA: Diagnosis not present

## 2015-03-03 DIAGNOSIS — Z0001 Encounter for general adult medical examination with abnormal findings: Secondary | ICD-10-CM

## 2015-03-03 LAB — POC HEMOCCULT BLD/STL (HOME/3-CARD/SCREEN)
FECAL OCCULT BLD: NEGATIVE
FECAL OCCULT BLD: NEGATIVE
FECAL OCCULT BLD: NEGATIVE

## 2015-08-09 ENCOUNTER — Encounter: Payer: Self-pay | Admitting: *Deleted

## 2016-02-11 ENCOUNTER — Encounter: Payer: Self-pay | Admitting: Internal Medicine

## 2016-03-09 ENCOUNTER — Ambulatory Visit (INDEPENDENT_AMBULATORY_CARE_PROVIDER_SITE_OTHER): Payer: Managed Care, Other (non HMO) | Admitting: Internal Medicine

## 2016-03-09 ENCOUNTER — Encounter: Payer: Self-pay | Admitting: Internal Medicine

## 2016-03-09 VITALS — BP 106/70 | HR 72 | Temp 97.9°F | Resp 16 | Ht 66.0 in | Wt 124.8 lb

## 2016-03-09 DIAGNOSIS — Z0000 Encounter for general adult medical examination without abnormal findings: Secondary | ICD-10-CM

## 2016-03-09 DIAGNOSIS — Z136 Encounter for screening for cardiovascular disorders: Secondary | ICD-10-CM | POA: Diagnosis not present

## 2016-03-09 DIAGNOSIS — Z111 Encounter for screening for respiratory tuberculosis: Secondary | ICD-10-CM | POA: Diagnosis not present

## 2016-03-09 DIAGNOSIS — E559 Vitamin D deficiency, unspecified: Secondary | ICD-10-CM | POA: Diagnosis not present

## 2016-03-09 DIAGNOSIS — R030 Elevated blood-pressure reading, without diagnosis of hypertension: Secondary | ICD-10-CM | POA: Diagnosis not present

## 2016-03-09 DIAGNOSIS — K219 Gastro-esophageal reflux disease without esophagitis: Secondary | ICD-10-CM

## 2016-03-09 DIAGNOSIS — Z79899 Other long term (current) drug therapy: Secondary | ICD-10-CM | POA: Diagnosis not present

## 2016-03-09 DIAGNOSIS — R5383 Other fatigue: Secondary | ICD-10-CM

## 2016-03-09 DIAGNOSIS — Z0001 Encounter for general adult medical examination with abnormal findings: Secondary | ICD-10-CM

## 2016-03-09 DIAGNOSIS — Z1212 Encounter for screening for malignant neoplasm of rectum: Secondary | ICD-10-CM

## 2016-03-09 DIAGNOSIS — E7800 Pure hypercholesterolemia, unspecified: Secondary | ICD-10-CM

## 2016-03-09 DIAGNOSIS — R7309 Other abnormal glucose: Secondary | ICD-10-CM

## 2016-03-09 LAB — HEMOGLOBIN A1C
HEMOGLOBIN A1C: 4.8 % (ref <5.7)
MEAN PLASMA GLUCOSE: 91 mg/dL

## 2016-03-09 LAB — CBC WITH DIFFERENTIAL/PLATELET
BASOS PCT: 0 %
Basophils Absolute: 0 cells/uL (ref 0–200)
Eosinophils Absolute: 100 cells/uL (ref 15–500)
Eosinophils Relative: 2 %
HEMATOCRIT: 41.6 % (ref 35.0–45.0)
Hemoglobin: 13.7 g/dL (ref 11.7–15.5)
LYMPHS ABS: 1350 {cells}/uL (ref 850–3900)
LYMPHS PCT: 27 %
MCH: 31.8 pg (ref 27.0–33.0)
MCHC: 32.9 g/dL (ref 32.0–36.0)
MCV: 96.5 fL (ref 80.0–100.0)
MONO ABS: 400 {cells}/uL (ref 200–950)
MPV: 11.4 fL (ref 7.5–12.5)
Monocytes Relative: 8 %
NEUTROS ABS: 3150 {cells}/uL (ref 1500–7800)
Neutrophils Relative %: 63 %
Platelets: 198 10*3/uL (ref 140–400)
RBC: 4.31 MIL/uL (ref 3.80–5.10)
RDW: 11.9 % (ref 11.0–15.0)
WBC: 5 10*3/uL (ref 3.8–10.8)

## 2016-03-09 LAB — TSH: TSH: 1.35 m[IU]/L

## 2016-03-09 LAB — VITAMIN B12: Vitamin B-12: 526 pg/mL (ref 200–1100)

## 2016-03-09 NOTE — Progress Notes (Signed)
[facility] Unk Pinto, M.D.    Danielle [name]. Danielle [name], P.A.-C      Starlyn Skeans, P.A.-C  [facility]                [address] [place] [id number] Telephone [phone] Telefax [phone]  Annual Screening/Preventative Visit & Comprehensive Evaluation &  Examination     This very nice 40 y.o. MWF presents for a Screening/Preventative Visit & comprehensive evaluation and management of multiple medical co-morbidities.  Patient is screened for elevated BP, abnormally elevated glucose or cholesterol  and Vitamin D Deficiency. Patient does have hx/o GERD controlled with diet.      Patient is followed expectantly for labile BP elevation. Patient's BP has been controlled at home and patient denies any cardiac symptoms as chest pain, palpitations, shortness of breath, dizziness or ankle swelling. Today's BP is at goal - 106/70.      Patient's hyperlipidemia is controlled with diet. She does have hx/o elevated Trig's.   Last lipids were at goal: Lab Results  Component Value Date   CHOL 176 02/05/2015   HDL 78 02/05/2015   LDLCALC 82 02/05/2015   TRIG 79 02/05/2015   CHOLHDL 2.3 02/05/2015      Patient has is proactively screened for prediabetes and patient denies reactive hypoglycemic symptoms, visual blurring, diabetic polys, or paresthesias. Last A1c was at goal: Lab Results  Component Value Date   HGBA1C 5.4 02/05/2015      Finally, patient has history of Vitamin D Deficiency and last Vitamin D was low: Lab Results  Component Value Date   VD25OH 42 02/05/2015   Current Outpatient Prescriptions on File Prior to Visit  Medication Sig  . cholecalciferol 2000 UNITS tablet Take 1 tablet (2,000 Units total) by mouth daily.  Marland Kitchen OVER THE COUNTER MEDICATION Tumeric 1 daily  . Probiotic Product (PROBIOTIC DAILY PO) Take by mouth.  Marland Kitchen OVER THE COUNTER MEDICATION Iron 65 mg tab 1 daily   No  current facility-administered medications on file prior to visit.    Allergies  Allergen Reactions  . Sulfa Antibiotics   . Levaquin [Levofloxacin In D5w] Rash   Past Medical History:  Diagnosis Date  . GERD (gastroesophageal reflux disease)   . Parvovirus infection 2013  . Shingles 2011  . UTI (lower urinary tract infection)   . Vitamin D deficiency    Health Maintenance  Topic Date Due  . PAP SMEAR  04/20/1995  . INFLUENZA VACCINE  10/21/2015  . TETANUS/TDAP  01/30/2016  . HIV Screening  Completed   Immunization History  Administered Date(s) Administered  . Influenza Whole 12/30/2011  . Influenza, Seasonal, Injecte, Preservative Fre 02/05/2015  . Influenza-Unspecified 01/21/2014  . PPD Test 01/23/2013, 01/23/2014, 02/05/2015  . Pneumococcal Conjugate-13 12/30/2011  . Td 01/29/2006   Past Surgical History:  Procedure Laterality Date  . ESOPHAGOGASTRODUODENOSCOPY ENDOSCOPY  2012   Family History  Problem Relation Age of Onset  . Cancer Maternal Grandmother     breast  . Cancer Maternal Grandfather     lung  . Cancer Paternal Grandfather     prostate  . Mitral valve prolapse Mother    Social History  Substance Use Topics  . Smoking status: Never Smoker  . Smokeless tobacco: Not on file  . Alcohol use No     Comment: rarely    ROS Constitutional: Denies fever,  chills, weight loss/gain, headaches, insomnia,  night sweats, and change in appetite. Does c/o fatigue occasionally. Eyes: Denies redness, blurred vision, diplopia, discharge, itchy, watery eyes.  ENT: Denies discharge, congestion, post nasal drip, epistaxis, sore throat, earache, hearing loss, dental pain, Tinnitus, Vertigo, Sinus pain, snoring.  Cardio: Denies chest pain, palpitations, irregular heartbeat, syncope, dyspnea, diaphoresis, orthopnea, PND, claudication, edema Respiratory: denies cough, dyspnea, DOE, pleurisy, hoarseness, laryngitis, wheezing.  Gastrointestinal: Denies dysphagia, heartburn,  reflux, water brash, pain, cramps, nausea, vomiting, bloating, diarrhea, constipation, hematemesis, melena, hematochezia, jaundice, hemorrhoids Genitourinary: Denies dysuria, frequency, urgency, nocturia, hesitancy, discharge, hematuria, flank pain Breast: Breast lumps, nipple discharge, bleeding.  Musculoskeletal: Denies arthralgia, myalgia, stiffness, Jt. Swelling, pain, limp, and strain/sprain. Denies falls. Skin: Denies puritis, rash, hives, warts, acne, eczema, changing in skin lesion Neuro: No weakness, tremor, incoordination, spasms, paresthesia, pain Psychiatric: Denies confusion, memory loss, sensory loss. Denies Depression. Endocrine: Denies change in weight, skin, hair change, nocturia, and paresthesia, diabetic polys, visual blurring, hyper / hypo glycemic episodes.  Heme/Lymph: No excessive bleeding, bruising, enlarged lymph nodes.  Physical Exam  BP 106/70   Pulse 72   Temp 97.9 F (36.6 C)   Resp 16   Ht 5\' 6"  (1.676 m)   Wt 124 lb 12.8 oz (56.6 kg)   BMI 20.14 kg/m   General Appearance: Well nourished and in no apparent distress.  Eyes: PERRLA, EOMs, conjunctiva no swelling or erythema, normal fundi and vessels. Sinuses: No frontal/maxillary tenderness ENT/Mouth: EACs patent / TMs  nl. Nares clear without erythema, swelling, mucoid exudates. Oral hygiene is good. No erythema, swelling, or exudate. Tongue normal, non-obstructing. Tonsils not swollen or erythematous. Hearing normal.  Neck: Supple, thyroid normal. No bruits, nodes or JVD. Respiratory: Respiratory effort normal.  BS equal and clear bilateral without rales, rhonci, wheezing or stridor. Cardio: Heart sounds are normal with regular rate and rhythm and no murmurs, rubs or gallops. Peripheral pulses are normal and equal bilaterally without edema. No aortic or femoral bruits. Chest: symmetric with normal excursions and percussion. Breasts: Symmetric, without lumps, nipple discharge, retractions, or fibrocystic  changes.  Abdomen: Flat, soft with bowel sounds active. Nontender, no guarding, rebound, hernias, masses, or organomegaly.  Lymphatics: Non tender without lymphadenopathy.  Genitourinary:  Musculoskeletal: Full ROM all peripheral extremities, joint stability, 5/5 strength, and normal gait. Skin: Warm and dry without rashes, lesions, cyanosis, clubbing or  ecchymosis.  Neuro: Cranial nerves intact, reflexes equal bilaterally. Normal muscle tone, no cerebellar symptoms. Sensation intact.  Pysch: Alert and oriented X 3, normal affect, Insight and Judgment appropriate.   Assessment and Plan  1. Annual Preventative Screening Examination   2. Elevated BP screening without diagnosis of hypertension  - Microalbumin / creatinine urine ratio - EKG 12-Lead - Urinalysis, Routine w reflex microscopic - CBC with Differential/Platelet - BASIC METABOLIC PANEL WITH GFR - TSH  3. Elevated cholesterol  - EKG 12-Lead - Hepatic function panel - Lipid panel - TSH  4. Other abnormal glucose  - EKG 12-Lead - Hemoglobin A1c - Insulin, random  5. Vitamin D deficiency  - VITAMIN D 25 Hydroxy  6. Gastroesophageal reflux disease   7. Screening for rectal cancer  - POC Hemoccult Bld/Stl   8. Screening examination for pulmonary tuberculosis  - PPD  9. Other fatigue  - Vitamin B12 - Iron and TIBC - CBC with Differential/Platelet  10. Screening for ischemic heart disease  - EKG 12-Lead  11. Medication management  - Urinalysis, Routine w reflex microscopic - CBC with Differential/Platelet -  BASIC METABOLIC PANEL WITH GFR - Hepatic function panel - Magnesium       Continue prudent diet as discussed, weight control, BP monitoring, regular exercise, and medications. Discussed med's effects and SE's. Screening labs and tests as requested with regular follow-up as recommended. Over 40 minutes of exam, counseling, chart review and high complex critical decision making was  performed.

## 2016-03-09 NOTE — Patient Instructions (Signed)

## 2016-03-10 LAB — URINALYSIS, ROUTINE W REFLEX MICROSCOPIC
BILIRUBIN URINE: NEGATIVE
GLUCOSE, UA: NEGATIVE
KETONES UR: NEGATIVE
Leukocytes, UA: NEGATIVE
Nitrite: NEGATIVE
PH: 5.5 (ref 5.0–8.0)
Protein, ur: NEGATIVE
Specific Gravity, Urine: 1.011 (ref 1.001–1.035)

## 2016-03-10 LAB — LIPID PANEL
Cholesterol: 167 mg/dL (ref <200)
HDL: 75 mg/dL (ref >50)
LDL Cholesterol: 82 mg/dL (ref <100)
TRIGLYCERIDES: 50 mg/dL (ref <150)
Total CHOL/HDL Ratio: 2.2 Ratio (ref <5.0)
VLDL: 10 mg/dL (ref <30)

## 2016-03-10 LAB — MICROALBUMIN / CREATININE URINE RATIO
CREATININE, URINE: 77 mg/dL (ref 20–320)
MICROALB UR: 0.2 mg/dL
Microalb Creat Ratio: 3 mcg/mg creat (ref <30)

## 2016-03-10 LAB — HEPATIC FUNCTION PANEL
ALK PHOS: 47 U/L (ref 33–115)
ALT: 10 U/L (ref 6–29)
AST: 14 U/L (ref 10–30)
Albumin: 4.1 g/dL (ref 3.6–5.1)
BILIRUBIN INDIRECT: 0.7 mg/dL (ref 0.2–1.2)
BILIRUBIN TOTAL: 0.9 mg/dL (ref 0.2–1.2)
Bilirubin, Direct: 0.2 mg/dL (ref <=0.2)
TOTAL PROTEIN: 6.5 g/dL (ref 6.1–8.1)

## 2016-03-10 LAB — BASIC METABOLIC PANEL WITH GFR
BUN: 10 mg/dL (ref 7–25)
CO2: 18 mmol/L — ABNORMAL LOW (ref 20–31)
Calcium: 9.1 mg/dL (ref 8.6–10.2)
Chloride: 104 mmol/L (ref 98–110)
Creat: 0.76 mg/dL (ref 0.50–1.10)
GLUCOSE: 90 mg/dL (ref 65–99)
POTASSIUM: 4.1 mmol/L (ref 3.5–5.3)
Sodium: 139 mmol/L (ref 135–146)

## 2016-03-10 LAB — URINALYSIS, MICROSCOPIC ONLY
Bacteria, UA: NONE SEEN [HPF]
Casts: NONE SEEN [LPF]
Crystals: NONE SEEN [HPF]
RBC / HPF: NONE SEEN RBC/HPF (ref <=2)
SQUAMOUS EPITHELIAL / LPF: NONE SEEN [HPF] (ref <=5)
WBC, UA: NONE SEEN WBC/HPF (ref <=5)
Yeast: NONE SEEN [HPF]

## 2016-03-10 LAB — IRON AND TIBC
%SAT: 34 % (ref 11–50)
Iron: 129 ug/dL (ref 40–190)
TIBC: 379 ug/dL (ref 250–450)
UIBC: 250 ug/dL (ref 125–400)

## 2016-03-10 LAB — MAGNESIUM: Magnesium: 1.9 mg/dL (ref 1.5–2.5)

## 2016-03-10 LAB — INSULIN, RANDOM: INSULIN: 4.9 u[IU]/mL (ref 2.0–19.6)

## 2016-03-10 LAB — VITAMIN D 25 HYDROXY (VIT D DEFICIENCY, FRACTURES): VIT D 25 HYDROXY: 67 ng/mL (ref 30–100)

## 2016-03-11 ENCOUNTER — Encounter: Payer: Self-pay | Admitting: Internal Medicine

## 2016-03-12 ENCOUNTER — Encounter: Payer: Self-pay | Admitting: Internal Medicine

## 2016-03-12 LAB — TB SKIN TEST
INDURATION: 0 mm
TB SKIN TEST: NEGATIVE

## 2016-04-07 ENCOUNTER — Encounter: Payer: Self-pay | Admitting: Internal Medicine

## 2016-04-15 ENCOUNTER — Encounter: Payer: Self-pay | Admitting: Internal Medicine

## 2016-04-15 ENCOUNTER — Other Ambulatory Visit: Payer: Self-pay | Admitting: *Deleted

## 2016-04-15 DIAGNOSIS — Z1212 Encounter for screening for malignant neoplasm of rectum: Secondary | ICD-10-CM

## 2016-04-15 LAB — POC HEMOCCULT BLD/STL (HOME/3-CARD/SCREEN)
Card #2 Fecal Occult Blod, POC: NEGATIVE
Card #3 Fecal Occult Blood, POC: NEGATIVE
Fecal Occult Blood, POC: NEGATIVE

## 2016-12-13 ENCOUNTER — Ambulatory Visit: Payer: Self-pay

## 2016-12-13 ENCOUNTER — Ambulatory Visit (INDEPENDENT_AMBULATORY_CARE_PROVIDER_SITE_OTHER): Payer: Managed Care, Other (non HMO) | Admitting: Sports Medicine

## 2016-12-13 ENCOUNTER — Encounter: Payer: Self-pay | Admitting: Sports Medicine

## 2016-12-13 VITALS — BP 104/66 | HR 77 | Wt 124.0 lb

## 2016-12-13 DIAGNOSIS — M25561 Pain in right knee: Secondary | ICD-10-CM

## 2016-12-13 DIAGNOSIS — M7121 Synovial cyst of popliteal space [Baker], right knee: Secondary | ICD-10-CM | POA: Diagnosis not present

## 2016-12-13 MED ORDER — DICLOFENAC SODIUM 2 % TD SOLN: 1.0000 "application " | Freq: Two times a day (BID) | TRANSDERMAL | 0 refills | Status: DC

## 2016-12-13 MED ORDER — DICLOFENAC SODIUM 2 % TD SOLN: 1.0000 "application " | Freq: Two times a day (BID) | TRANSDERMAL | 2 refills | Status: DC

## 2016-12-13 NOTE — Procedures (Signed)
LIMITED MSK ULTRASOUND OF RIGHT KNEE Images were obtained and interpreted by myself, Teresa Coombs, DO  Images have been saved and stored to PACS system. Images obtained on: GE S7 Ultrasound machine  FINDINGS:  Patella & Patellar Tendon: Normal Quad & Quad Tendon: Normal Suprapatellar Pouch: Marked synovitis with moderate effusion Medial Joint Line: Bulging meniscus without appreciable meniscal tear Lateral Joint Line: Normal-appearing meniscus, no significant arthropathy Trochlea: Normal appearing cartilage no significant arthropathy Posterior knee: Very large Baker's cyst with slight gelatinous appearance but compressible  IMPRESSION:  1. Generative medial meniscus with underlying generalized synovitis and large Baker's cyst

## 2016-12-13 NOTE — Progress Notes (Signed)
OFFICE VISIT NOTE Danielle [name]. Danielle [name], [place] [facility] [place]  Danielle [name] - 41 y.o. female MRN [id number]  Date of birth: [birth year]  Visit Date: 12/13/2016  PCP: Unk Pinto, MD   Referred by: Unk Pinto, MD  Thalia Bloodgood PT, LAT, ATC acting as scribe for Dr. Paulla Fore.  SUBJECTIVE:   Chief Complaint  Patient presents with  . New Patient (Initial Visit)    RT knee pain   HPI: As below and per problem based documentation when appropriate.  Danielle [name] is a new patient presenting today for evaluation of RT knee pain.   Pt states that she has had R knee pain since May.  She reports no MOI just a gradual onset of R knee pain.  She has been to [employer] to get new shoes and some exercise recommendations.  She is a runner but has not run since May when her R knee pain began.  She has been to see Dr. Celene Kras at the end of Aug. 2018 and has been seeing him 2x/week x 4 weeks.  Dr. Celene Kras suggested that she try to run to see what her status was and she reports that she continues to have the R knee pain.  She states that the pain she experiences runs across the front of her knee in a diagonal pattern from the superior-lateral to the inferior-medial aspect of her knee.  She denies any pain at rest, just tightness.  She notes when she attempted to run again last week that the entire knee hurt and rates it as a 3-4/10.  The most significant pain occurs a couple hours after activity when she rates the pain as a 6-7/10.  She also notes some swelling/hardness along the post aspect of her knee that the pt notes Dr. Celene Kras thinks may be a Baker's cyst.  She also states that she purchased a foam roller and has been rolling her B LEs.    Worsened with activity but only after activity. Improves with ice and rest. Therapies tried include chiropractic care w/ Dr. Celene Kras, new running shoes, ther ex including foam rolling and bridges.       Review of Systems  HENT: Negative.   Eyes: Negative.   Respiratory: Negative.   Cardiovascular: Negative.   Gastrointestinal: Negative.   Genitourinary: Negative.   Musculoskeletal: Positive for joint pain.  Skin: Negative.   Neurological: Negative.   Endo/Heme/Allergies: Negative.   Psychiatric/Behavioral: Negative.     Otherwise per HPI.  HISTORY & PERTINENT PRIOR DATA:  No specialty comments available. She reports that she has never smoked. She has never used smokeless tobacco.   Recent Labs  03/09/16 0931  HGBA1C 4.8   Medications & Allergies reviewed per EMR Patient Active Problem List   Diagnosis Date Noted  . Baker's cyst of knee, right 12/13/2016  . Body mass index (BMI) of 20.0-20.9 in adult 02/05/2015  . Medication management 02/05/2015  . GERD (gastroesophageal reflux disease) 12/29/2012  . Vitamin D deficiency    Past Medical History:  Diagnosis Date  . GERD (gastroesophageal reflux disease)   . Parvovirus infection 2013  . Shingles 2011  . UTI (lower urinary tract infection)   . Vitamin D deficiency    Family History  Problem Relation Age of Onset  . Cancer Maternal Grandmother        breast  . Cancer Maternal Grandfather        lung  . Cancer Paternal  Grandfather        prostate  . Mitral valve prolapse Mother    Past Surgical History:  Procedure Laterality Date  . ESOPHAGOGASTRODUODENOSCOPY ENDOSCOPY  2012   Social History   Occupational History  . Not on file.   Social History Main Topics  . Smoking status: Never Smoker  . Smokeless tobacco: Never Used  . Alcohol use No     Comment: rarely  . Drug use: No  . Sexual activity: Not on file    OBJECTIVE:  VS:  HT:    WT:124 lb (56.2 kg)  BMI:     BP:104/66  HR:77bpm  TEMP: ( )  RESP:98 % EXAM: Findings:  WDWN, NAD, Non-toxic appearing Alert & appropriately interactive Not depressed or anxious appearing No increased work of breathing. Pupils are equal. EOM intact without  nystagmus No clubbing or cyanosis of the extremities appreciated No significant rashes/lesions/ulcerations overlying the examined area. DP & PT pulses 2+/4.  No significant pretibial edema.  No clubbing or cyanosis Sensation intact to light touch in lower extremities.  Right Knee: Overall joint is well aligned, no significant deformity.   Small to moderate effusion with very large Baker's cyst that is tense.   ROM: 0 to 120.   Extensor mechanism intact No focal medial joint line pain although slightly tender over the medial tibial plateau.   Stable to varus/valgus strain & anterior/posterior drawer. .   Negative McMurray's and Thessaly.       [place] Limited Joint Space Structures Low Right(no Linked Charges)  Result Date: 12/13/2016 Gerda Diss, DO     12/13/2016  9:42 AM LIMITED MSK ULTRASOUND OF RIGHT KNEE Images were obtained and interpreted by myself, Teresa Coombs, DO Images have been saved and stored to PACS system. Images obtained on: GE S7 Ultrasound machine FINDINGS: Patella & Patellar Tendon: Normal Quad & Quad Tendon: Normal Suprapatellar Pouch: Marked synovitis with moderate effusion Medial Joint Line: Bulging meniscus without appreciable meniscal tear Lateral Joint Line: Normal-appearing meniscus, no significant arthropathy Trochlea: Normal appearing cartilage no significant arthropathy Posterior knee: Very large Baker's cyst with slight gelatinous appearance but compressible IMPRESSION: 1. Generative medial meniscus with underlying generalized synovitis and large Baker's cyst   ASSESSMENT & PLAN:     ICD-10-CM   1. Right knee pain, unspecified chronicity M25.561 [place] LIMITED JOINT SPACE STRUCTURES LOW RIGHT(NO LINKED CHARGES)  2. Baker's cyst of knee, right M71.21   ================================================================= Baker's cyst of knee, right Very large Baker's cyst with underlying synovitis and likely degenerative medial meniscus tear.  Discussed multiple  options including topical anti-inflammatories and compression and/or injection but she would like to defer this at this time.  Intervention at this time other than as outlined per APS.  We will plan to have her continue with icing, avoidance of exacerbating activities and have her follow-up in 2 weeks.  If any lack of improvement can consider injection at that time. ================================================================= Patient Instructions  I recommend you obtained a compression sleeve to help with your joint problems. There are many options on the market however I recommend obtaining a medium full knee Body Helix compression sleeve.  You can find information (including how to appropriate measure yourself for sizing) can be found at www.Body http://www.lambert.com/.  Many of these products are health savings account (HSA) eligible.   You can use the compression sleeve at any time throughout the day but is most important to use while being active as well as for 2 hours post-activity.  It is appropriate to ice following activity with the compression sleeve in place.   =================================================================  Follow-up: Return in about 2 weeks (around 12/27/2016).   CMA/ATC served as Education administrator during this visit. History, Physical, and Plan performed by medical provider. Documentation and orders reviewed and attested to.      Teresa Coombs, [facility] Physician

## 2016-12-13 NOTE — Assessment & Plan Note (Signed)
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  Discussed multiple options including topical anti-inflammatories and compression and/or injection but she would like to defer this at this time.  Intervention at this time other than as outlined per APS.  We will plan to have her continue with icing, avoidance of exacerbating activities and have her follow-up in 2 weeks.  If any lack of improvement can consider injection at that time.

## 2016-12-13 NOTE — Patient Instructions (Signed)
I recommend you obtained a compression sleeve to help with your joint problems. There are many options on the market however I recommend obtaining a medium full knee Body Helix compression sleeve.  You can find information (including how to appropriate measure yourself for sizing) can be found at www.Body http://www.lambert.com/.  Many of these products are health savings account (HSA) eligible.   You can use the compression sleeve at any time throughout the day but is most important to use while being active as well as for 2 hours post-activity.   It is appropriate to ice following activity with the compression sleeve in place.

## 2016-12-14 ENCOUNTER — Telehealth: Payer: Self-pay | Admitting: Sports Medicine

## 2016-12-14 ENCOUNTER — Other Ambulatory Visit: Payer: Self-pay

## 2016-12-14 MED ORDER — DICLOFENAC SODIUM 2 % TD SOLN: 1.0000 "application " | Freq: Two times a day (BID) | TRANSDERMAL | 0 refills | Status: AC

## 2016-12-14 NOTE — Telephone Encounter (Signed)
Patient script is not with mail order pharm. She called [facility] and they said they didn't receive anything.  Also she needs to know if she needs to sign up to use this pharmacy.  If it's not preferred for her to use them, please send the Pennsaid to [facility] on [address].  Ty,  -LL

## 2016-12-15 ENCOUNTER — Other Ambulatory Visit: Payer: Self-pay

## 2016-12-15 MED ORDER — DICLOFENAC SODIUM 2 % TD SOLN: 1.0000 "application " | Freq: Two times a day (BID) | TRANSDERMAL | 2 refills | Status: DC

## 2016-12-15 NOTE — Telephone Encounter (Signed)
Rx resent to [facility]. Pt can contact [facility] at [phone] (Phone) regarding cost and coverage.

## 2016-12-15 NOTE — Telephone Encounter (Signed)
Spoke with patient and she advised that she has been contacted by the pharmacy and her medication wont arrive until Tuesday. She is requesting that additional samples be left at the front for pick up. Samples at the front for pick up, pt aware.

## 2016-12-27 ENCOUNTER — Ambulatory Visit (INDEPENDENT_AMBULATORY_CARE_PROVIDER_SITE_OTHER): Payer: Managed Care, Other (non HMO) | Admitting: Sports Medicine

## 2016-12-27 ENCOUNTER — Encounter: Payer: Self-pay | Admitting: Sports Medicine

## 2016-12-27 VITALS — BP 104/70 | HR 62 | Ht 66.0 in | Wt 124.2 lb

## 2016-12-27 DIAGNOSIS — M25561 Pain in right knee: Secondary | ICD-10-CM | POA: Diagnosis not present

## 2016-12-27 DIAGNOSIS — M7121 Synovial cyst of popliteal space [Baker], right knee: Secondary | ICD-10-CM

## 2016-12-27 NOTE — Patient Instructions (Signed)
Please perform the exercise program that we have prepared for you and gone over in detail on a daily basis.  In addition to the handout you were provided you can access your program through: [email or web address]   Your unique program code is: [email or web address]

## 2016-12-27 NOTE — Progress Notes (Signed)
OFFICE VISIT NOTE Danielle [name]. Danielle [name], Danielle [name]  Danielle [name] - 41 y.o. female MRN [id number]  Date of birth: 1975/04/04  Visit Date: 12/27/2016  PCP: Unk Pinto, MD   Referred by: Unk Pinto, MD  Thalia Bloodgood PT, LAT, ATC acting as scribe for Dr. Paulla Fore.  SUBJECTIVE:   Chief Complaint  Patient presents with  . Follow-up    R knee pain   HPI: As below and per problem based documentation when appropriate.  Danielle [name] is an established pt presenting today for f/u of her R knee pain.  At her last visit, the pt was prescribed Pennsaid and given a Body Helix knee sleeve.  She notes that she has been using the Pennsaid and the Body Helix as prescribed.  Pt states that she feels like the Baker's cyst has improved.  She also notes that the tightness in her R knee has improved but she still feels an aching pain in her ant-med knee.  Pt states that she feels like she has had a 50% improvement in her symptoms.  Her biggest concern at this point is how her R knee will feel when she resumes/increases activity.  She also reiterates that she is wondering if the change in her running shoe footwear had anything to do w/ the initiation of her R knee pain as she was put in a completely different running shoe at [employer] from the shoe she had been wearing for years.    Review of Systems  Constitutional: Negative.   HENT: Negative.   Eyes: Negative.   Respiratory: Negative.   Cardiovascular: Negative.   Gastrointestinal: Negative.   Genitourinary: Negative.   Musculoskeletal: Positive for joint pain. Negative for falls.  Skin: Negative.   Neurological: Negative.   Endo/Heme/Allergies: Negative.   Psychiatric/Behavioral: Negative.     Otherwise per HPI.  HISTORY & PERTINENT PRIOR DATA:  No specialty comments available. She reports that she has never smoked. She has never used smokeless tobacco.   Recent Labs  03/09/16 0931  HGBA1C 4.8   Allergies reviewed per EMR Prior to Admission medications   Medication Sig Start Date End Date Taking? Authorizing Provider  cholecalciferol 2000 UNITS tablet Take 1 tablet (2,000 Units total) by mouth daily. 01/23/13   Unk Pinto, MD  Diclofenac Sodium (PENNSAID) 2 % SOLN Place 1 application onto the skin 2 (two) times daily. 12/15/16   Gerda Diss, DO  glucosamine-chondroitin 500-400 MG tablet Take 1 tablet by mouth 3 (three) times daily.    [provider]  MAGNESIUM PO Take 1 tablet by mouth daily.    [provider]  Omega-3 Fatty Acids (FISH OIL PO) Take by mouth daily.    [provider]  OVER THE COUNTER MEDICATION Tumeric 1 daily    [provider]  OVER THE COUNTER MEDICATION Hylands seasonal allergy relief(homeopathic) 1 daily    [provider]  Probiotic Product (PROBIOTIC DAILY PO) Take by mouth.    [provider]  ranitidine (ZANTAC) 75 MG tablet Take 75 mg by mouth. PRN    [provider]   Patient Active Problem List   Diagnosis Date Noted  . Baker's cyst of knee, right 12/13/2016  . Body mass index (BMI) of 20.0-20.9 in adult 02/05/2015  . Medication management 02/05/2015  . GERD (gastroesophageal reflux disease) 12/29/2012  . Vitamin D deficiency    Past Medical History:  Diagnosis Date  . GERD (  gastroesophageal reflux disease)   . Parvovirus infection 2013  . Shingles 2011  . UTI (lower urinary tract infection)   . Vitamin D deficiency    Family History  Problem Relation Age of Onset  . Cancer Maternal Grandmother        breast  . Cancer Maternal Grandfather        lung  . Cancer Paternal Grandfather        prostate  . Mitral valve prolapse Mother    Past Surgical History:  Procedure Laterality Date  . ESOPHAGOGASTRODUODENOSCOPY ENDOSCOPY  2012   Social History   Occupational History  . Not on file.   Social History Main Topics  . Smoking  status: Never Smoker  . Smokeless tobacco: Never Used  . Alcohol use No     Comment: rarely  . Drug use: No  . Sexual activity: Not on file    OBJECTIVE:  VS:  HT:5\' 6"  (167.6 cm)   WT:124 lb 3.2 oz (56.3 kg)  BMI:20.06    BP:104/70  HR:62bpm  TEMP: ( )  RESP:100 % EXAM: Findings:  Adult female.  No acute distress.  Right knee is overall well aligned.  She does have some VMO atrophy on the right compared to the left but this is minimal.  She has overall good patellar tracking but does have a positive J sign with passive range of motion.  Ligamentously she is stable.  She does have a small click with McMurray's.  She has no intra-articular effusion but does have a large Baker's cyst that is 4-5 cm in diameter but is non-tense at this time.    RADIOLOGY: [place] LIMITED JOINT SPACE STRUCTURES LOW RIGHT(NO LINKED CHARGES) Gerda Diss, DO     12/13/2016  9:42 AM LIMITED MSK ULTRASOUND OF RIGHT KNEE Images were obtained and interpreted by myself, Teresa Coombs, DO   Images have been saved and stored to PACS system. Images obtained on: GE S7 Ultrasound machine  FINDINGS:  Patella & Patellar Tendon: Normal Quad & Quad Tendon: Normal Suprapatellar Pouch: Marked synovitis with moderate effusion Medial Joint Line: Bulging meniscus without appreciable meniscal  tear Lateral Joint Line: Normal-appearing meniscus, no significant  arthropathy Trochlea: Normal appearing cartilage no significant arthropathy Posterior knee: Very large Baker's cyst with slight gelatinous  appearance but compressible  IMPRESSION:  1. 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 Right knee pain, unspecified chronicity M25.561 Misc procedure  2. Baker's cyst of knee, right M71.21 Misc procedure   ================================================================= Baker's cyst of knee, right Definitely improved but still  present.  Less tense today.  No pain with palpation.  She does have a small amount of pain over the medial joint line but this is minimal.  She does have some crepitation with McMurray's but this is nonpainful and does not lock.  Given the known underlying findings on ultrasound I suspect degenerative meniscal tear that is contributing to Baker's cyst.  She has had 50% improvement with Pennsaid and Body Helix and I would like for her to begin increasing her activity including therapeutic exercises reviewed as below with focus on VMO activation given the discrepancy from side to side.  We will plan to follow-up with her in 4 weeks and if any lack of improvement or any worsening injection will be indicated.  PROCEDURE NOTE: THERAPEUTIC EXERCISES (97110) 15 minutes spent for Therapeutic exercises as below and as referenced  in the AVS. This included exercises focusing on stretching, strengthening, with significant focus on eccentric aspects.  Proper technique shown and discussed handout in great detail with ATC. All questions were discussed and answered.   Long term goals include an improvement in range of motion, strength, endurance as well as avoiding reinjury. Frequency of visits is one time as determined during today's  office visit. Frequency of exercises to be performed is as per handout.  EXERCISES REVIEWED:  Quad sets & hip abduction  ================================================================= Patient Instructions  Please perform the exercise program that we have prepared for you and gone over in detail on a daily basis.  In addition to the handout you were provided you can access your program through: [email or web address]   Your unique program code is: [email or web address]  =================================================================  Follow-up: Return in about 4 weeks (around 01/24/2017).   CMA/ATC served as Education administrator during this visit. History, Physical, and Plan performed by medical  provider. Documentation and orders reviewed and attested to.      Teresa Coombs, [place] [facility] Physician

## 2016-12-27 NOTE — Assessment & Plan Note (Signed)
Definitely improved but still present.  Less tense today.  No pain with palpation.  She does have a small amount of pain over the medial joint line but this is minimal.  She does have some crepitation with McMurray's but this is nonpainful and does not lock.  Given the known underlying findings on ultrasound I suspect degenerative meniscal tear that is contributing to Baker's cyst.  She has had 50% improvement with Pennsaid and Body Helix and I would like for her to begin increasing her activity including therapeutic exercises reviewed as below with focus on VMO activation given the discrepancy from side to side.  We will plan to follow-up with her in 4 weeks and if any lack of improvement or any worsening injection will be indicated.

## 2016-12-27 NOTE — Procedures (Signed)
PROCEDURE NOTE: THERAPEUTIC EXERCISES (97110) 15 minutes spent for Therapeutic exercises as below and as referenced in the AVS. This included exercises focusing on stretching, strengthening, with significant focus on eccentric aspects.  Proper technique shown and discussed handout in great detail with ATC. All questions were discussed and answered.   Long term goals include an improvement in range of motion, strength, endurance as well as avoiding reinjury. Frequency of visits is one time as determined during today's  office visit. Frequency of exercises to be performed is as per handout.  EXERCISES REVIEWED:  Quad sets & hip abduction

## 2017-01-24 ENCOUNTER — Ambulatory Visit: Payer: Managed Care, Other (non HMO) | Admitting: Sports Medicine

## 2017-04-11 ENCOUNTER — Encounter: Payer: Self-pay | Admitting: Internal Medicine

## 2017-04-13 ENCOUNTER — Encounter: Payer: Self-pay | Admitting: Internal Medicine

## 2017-04-13 ENCOUNTER — Ambulatory Visit (INDEPENDENT_AMBULATORY_CARE_PROVIDER_SITE_OTHER): Payer: Managed Care, Other (non HMO) | Admitting: Internal Medicine

## 2017-04-13 VITALS — BP 116/70 | HR 68 | Temp 97.7°F | Resp 16 | Ht 65.25 in | Wt 122.4 lb

## 2017-04-13 DIAGNOSIS — K219 Gastro-esophageal reflux disease without esophagitis: Secondary | ICD-10-CM

## 2017-04-13 DIAGNOSIS — Z0000 Encounter for general adult medical examination without abnormal findings: Secondary | ICD-10-CM | POA: Diagnosis not present

## 2017-04-13 DIAGNOSIS — Z1211 Encounter for screening for malignant neoplasm of colon: Secondary | ICD-10-CM

## 2017-04-13 DIAGNOSIS — R7309 Other abnormal glucose: Secondary | ICD-10-CM

## 2017-04-13 DIAGNOSIS — Z79899 Other long term (current) drug therapy: Secondary | ICD-10-CM | POA: Diagnosis not present

## 2017-04-13 DIAGNOSIS — R5383 Other fatigue: Secondary | ICD-10-CM

## 2017-04-13 DIAGNOSIS — Z111 Encounter for screening for respiratory tuberculosis: Secondary | ICD-10-CM

## 2017-04-13 DIAGNOSIS — R030 Elevated blood-pressure reading, without diagnosis of hypertension: Secondary | ICD-10-CM | POA: Diagnosis not present

## 2017-04-13 DIAGNOSIS — E7800 Pure hypercholesterolemia, unspecified: Secondary | ICD-10-CM

## 2017-04-13 DIAGNOSIS — Z1212 Encounter for screening for malignant neoplasm of rectum: Secondary | ICD-10-CM

## 2017-04-13 DIAGNOSIS — Z0001 Encounter for general adult medical examination with abnormal findings: Secondary | ICD-10-CM

## 2017-04-13 DIAGNOSIS — Z136 Encounter for screening for cardiovascular disorders: Secondary | ICD-10-CM | POA: Diagnosis not present

## 2017-04-13 DIAGNOSIS — E559 Vitamin D deficiency, unspecified: Secondary | ICD-10-CM | POA: Diagnosis not present

## 2017-04-13 NOTE — Patient Instructions (Signed)

## 2017-04-13 NOTE — Progress Notes (Signed)
[facility] Unk Pinto, M.D.     Uvaldo Bristle. Silverio Lay, P.A.-C Liane Comber, [place] [address] [place], [place] [address] Telephone [phone] Telefax [phone] Annual Screening/Preventative Visit & Comprehensive Evaluation &  Examination     This very nice 42 y.o. MWF presents for a Screening/Preventative Visit & comprehensive evaluation and management of multiple medical co-morbidities.  Patient has been followed expectantly for HTN, Prediabetes, Hyperlipidemia and Vitamin D Deficiency. Patient has hx/o GERD controlled w/ prudent diet and Ranitidine at hour of sleep. Reports had Rt knee pain and dx'd with Rt popliteal cyst by Orthopedist and she stopped jogging & cyst resolved.      Patient is followed expectantly for labile HTN. Patient's BP has been controlled at home and patient denies any cardiac symptoms as chest pain, palpitations, shortness of breath, dizziness or ankle swelling. Today's BP is at goal - 116/70.      Patient's hyperlipidemia is controlled with diet and medications. Patient denies myalgias or other medication SE's. Last lipids were at goal: Lab Results  Component Value Date   CHOL 167 03/09/2016   HDL 75 03/09/2016   LDLCALC 82 03/09/2016   TRIG 50 03/09/2016   CHOLHDL 2.2 03/09/2016      Patient is screened expectantly for prediabetes and patient denies reactive hypoglycemic symptoms, visual blurring, diabetic polys or paresthesias. Last A1c was Normal & at goal:  Lab Results  Component Value Date   HGBA1C 4.8 03/09/2016      Finally, patient has history of Vitamin D Deficiency ("42" on treatment in 2016) and last Vitamin D was at goal: Lab Results  Component Value Date   VD25OH 67 03/09/2016   Current Outpatient Medications on File Prior to Visit  Medication Sig  . Cholecalciferol (VITAMIN D PO) Take 5,000 Units by mouth.  Marland Kitchen glucosamine-chondroitin 500-400 MG tablet  Take 1 tablet by mouth 3 (three) times daily.  Marland Kitchen MAGNESIUM PO Take 1 tablet by mouth daily.  . Omega-3 Fatty Acids (FISH OIL PO) Take by mouth daily.  Marland Kitchen OVER THE COUNTER MEDICATION Tumeric 1 daily  . OVER THE COUNTER MEDICATION Hylands seasonal allergy relief(homeopathic) 1 daily  . Prenatal Vit-Fe Fumarate-FA (M-VIT PO) Take 1 tablet by mouth daily. OTC MVI  . ranitidine (ZANTAC) 75 MG tablet Take 75 mg by mouth. PRN   No current facility-administered medications on file prior to visit.    Allergies  Allergen Reactions  . Sulfa Antibiotics   . Levaquin [Levofloxacin In D5w] Rash   Past Medical History:  Diagnosis Date  . GERD (gastroesophageal reflux disease)   . Parvovirus infection 2013  . Shingles 2011  . UTI (lower urinary tract infection)   . Vitamin D deficiency    Health Maintenance  Topic Date Due  . PAP SMEAR  04/20/1995  . INFLUENZA VACCINE  10/20/2016  . TETANUS/TDAP  06/22/2026  . HIV Screening  Completed   Immunization History  Administered Date(s) Administered  . Influenza Whole 12/30/2011  . Influenza, Seasonal, Injecte, Preservative Fre 02/05/2015  . Influenza-Unspecified 01/21/2014  . PPD Test 01/23/2013, 01/23/2014, 02/05/2015, 03/09/2016  . Pneumococcal Conjugate-13 12/30/2011  . Td 01/29/2006  . Tdap 06/21/2016    Pap scheduled 06/2017 w/ Dr Ronita Hipps.   Past Surgical History:  Procedure Laterality Date  . ESOPHAGOGASTRODUODENOSCOPY ENDOSCOPY  2012   Family History  Problem Relation Age of Onset  . Cancer Maternal Grandmother        breast  . Cancer  Maternal Grandfather        lung  . Cancer Paternal Grandfather        prostate  . Mitral valve prolapse Mother    Social History   Tobacco Use  . Smoking status: Never Smoker  . Smokeless tobacco: Never Used  Substance Use Topics  . Alcohol use: No    Alcohol/week: 0.0 oz    Comment: rarely  . Drug use: No    ROS Constitutional: Denies fever, chills, weight loss/gain, headaches,  insomnia,  night sweats, and change in appetite. Does c/o fatigue. Eyes: Denies redness, blurred vision, diplopia, discharge, itchy, watery eyes.  ENT: Denies discharge, congestion, post nasal drip, epistaxis, sore throat, earache, hearing loss, dental pain, Tinnitus, Vertigo, Sinus pain, snoring.  Cardio: Denies chest pain, palpitations, irregular heartbeat, syncope, dyspnea, diaphoresis, orthopnea, PND, claudication, edema Respiratory: denies cough, dyspnea, DOE, pleurisy, hoarseness, laryngitis, wheezing.  Gastrointestinal: Denies dysphagia, heartburn, reflux, water brash, pain, cramps, nausea, vomiting, bloating, diarrhea, constipation, hematemesis, melena, hematochezia, jaundice, hemorrhoids Genitourinary: Denies dysuria, frequency, urgency, nocturia, hesitancy, discharge, hematuria, flank pain Breast: Breast lumps, nipple discharge, bleeding.  Musculoskeletal: Denies arthralgia, myalgia, stiffness, Jt. Swelling, pain, limp, and strain/sprain. Denies falls. Skin: Denies puritis, rash, hives, warts, acne, eczema, changing in skin lesion Neuro: No weakness, tremor, incoordination, spasms, paresthesia, pain Psychiatric: Denies confusion, memory loss, sensory loss. Denies Depression. Endocrine: Denies change in weight, skin, hair change, nocturia, and paresthesia, diabetic polys, visual blurring, hyper / hypo glycemic episodes.  Heme/Lymph: No excessive bleeding, bruising, enlarged lymph nodes.  Physical Exam  BP 116/70   Pulse 68   Temp 97.7 F (36.5 C)   Resp 16   Ht 5' 5.25" (1.657 m)   Wt 122 lb 6.4 oz (55.5 kg)   BMI 20.21 kg/m   General Appearance: Well nourished, well groomed and in no apparent distress.  Eyes: PERRLA, EOMs, conjunctiva no swelling or erythema, normal fundi and vessels. Sinuses: No frontal/maxillary tenderness ENT/Mouth: EACs patent / TMs  nl. Nares clear without erythema, swelling, mucoid exudates. Oral hygiene is good. No erythema, swelling, or exudate.  Tongue normal, non-obstructing. Tonsils not swollen or erythematous. Hearing normal.  Neck: Supple, thyroid normal. No bruits, nodes or JVD. Respiratory: Respiratory effort normal.  BS equal and clear bilateral without rales, rhonci, wheezing or stridor. Cardio: Heart sounds are normal with regular rate and rhythm and no murmurs, rubs or gallops. Peripheral pulses are normal and equal bilaterally without edema. No aortic or femoral bruits. Chest: symmetric with normal excursions and percussion. Breasts: Deferred to Dr Ronita Hipps Abdomen: Flat, soft with bowel sounds active. Nontender, no guarding, rebound, hernias, masses, or organomegaly.  Lymphatics: Non tender without lymphadenopathy.  Genitourinary: Deferred to Dr Ronita Hipps Musculoskeletal: Full ROM all peripheral extremities, joint stability, 5/5 strength, and normal gait. Skin: Warm and dry without rashes, lesions, cyanosis, clubbing or  ecchymosis.  Neuro: Cranial nerves intact, reflexes equal bilaterally. Normal muscle tone, no cerebellar symptoms. Sensation intact.  Pysch: Alert and oriented X 3, normal affect, Insight and Judgment appropriate.   Assessment and Plan  1. Annual Preventative Screening Examination  2. Elevated BP without diagnosis of hypertension  - EKG 12-Lead - Urinalysis, Routine w reflex microscopic - Microalbumin / creatinine urine ratio - CBC with Differential/Platelet - BASIC METABOLIC PANEL WITH GFR - Magnesium - TSH  3. Elevated cholesterol  - EKG 12-Lead - Hepatic function panel - Lipid panel - TSH  4. Other abnormal glucose  - Hemoglobin A1c - Insulin, random  5. Vitamin D deficiency  -  VITAMIN D 25 Hydroxy   6. Gastroesophageal reflux disease  - CBC with Differential/Platelet  7. Screening for colorectal cancer  - POC Hemoccult Bld/Stl  8. Screening examination for pulmonary tuberculosis  - PPD  9. Screening for ischemic heart disease  - EKG 12-Lead  10. Fatigue  -  Iron,Total/Total Iron Binding Cap - Vitamin B12 - CBC with Differential/Platelet - TSH  11. Medication management  - Urinalysis, Routine w reflex microscopic - Microalbumin / creatinine urine ratio - CBC with Differential/Platelet - BASIC METABOLIC PANEL WITH GFR - Hepatic function panel - Magnesium - Lipid panel - TSH - Hemoglobin A1c - Insulin, random - VITAMIN D 25 Hydroxy        Patient was counseled in prudent diet to achieve/maintain BMI less than 25 for weight control, BP monitoring, regular exercise and medications. Discussed med's effects and SE's. Screening labs and tests as requested with regular follow-up as recommended. Over 40 minutes of exam, counseling, chart review and high complex critical decision making was performed.

## 2017-04-14 LAB — HEPATIC FUNCTION PANEL
AG RATIO: 1.7 (calc) (ref 1.0–2.5)
ALKALINE PHOSPHATASE (APISO): 45 U/L (ref 33–115)
ALT: 10 U/L (ref 6–29)
AST: 15 U/L (ref 10–30)
Albumin: 4.4 g/dL (ref 3.6–5.1)
BILIRUBIN DIRECT: 0.2 mg/dL (ref 0.0–0.2)
BILIRUBIN INDIRECT: 0.8 mg/dL (ref 0.2–1.2)
BILIRUBIN TOTAL: 1 mg/dL (ref 0.2–1.2)
Globulin: 2.6 g/dL (calc) (ref 1.9–3.7)
TOTAL PROTEIN: 7 g/dL (ref 6.1–8.1)

## 2017-04-14 LAB — URINALYSIS, ROUTINE W REFLEX MICROSCOPIC
Bacteria, UA: NONE SEEN /HPF
Bilirubin Urine: NEGATIVE
GLUCOSE, UA: NEGATIVE
HYALINE CAST: NONE SEEN /LPF
Ketones, ur: NEGATIVE
Leukocytes, UA: NEGATIVE
Nitrite: NEGATIVE
PROTEIN: NEGATIVE
RBC / HPF: NONE SEEN /HPF (ref 0–2)
Specific Gravity, Urine: 1.006 (ref 1.001–1.03)
Squamous Epithelial / LPF: NONE SEEN /HPF (ref <=5)
WBC UA: NONE SEEN /HPF (ref 0–5)
pH: 7 (ref 5.0–8.0)

## 2017-04-14 LAB — CBC WITH DIFFERENTIAL/PLATELET
BASOS ABS: 30 {cells}/uL (ref 0–200)
Basophils Relative: 0.5 %
EOS ABS: 108 {cells}/uL (ref 15–500)
Eosinophils Relative: 1.8 %
HCT: 39.5 % (ref 35.0–45.0)
HEMOGLOBIN: 13.7 g/dL (ref 11.7–15.5)
Lymphs Abs: 1350 cells/uL (ref 850–3900)
MCH: 32.8 pg (ref 27.0–33.0)
MCHC: 34.7 g/dL (ref 32.0–36.0)
MCV: 94.5 fL (ref 80.0–100.0)
MPV: 12 fL (ref 7.5–12.5)
Monocytes Relative: 6.4 %
NEUTROS ABS: 4128 {cells}/uL (ref 1500–7800)
Neutrophils Relative %: 68.8 %
Platelets: 189 10*3/uL (ref 140–400)
RBC: 4.18 10*6/uL (ref 3.80–5.10)
RDW: 10.9 % — ABNORMAL LOW (ref 11.0–15.0)
Total Lymphocyte: 22.5 %
WBC: 6 10*3/uL (ref 3.8–10.8)
WBCMIX: 384 {cells}/uL (ref 200–950)

## 2017-04-14 LAB — BASIC METABOLIC PANEL WITH GFR
BUN: 13 mg/dL (ref 7–25)
CO2: 25 mmol/L (ref 20–32)
CREATININE: 0.92 mg/dL (ref 0.50–1.10)
Calcium: 9.4 mg/dL (ref 8.6–10.2)
Chloride: 104 mmol/L (ref 98–110)
GFR, EST AFRICAN AMERICAN: 89 mL/min/{1.73_m2} (ref >=60)
GFR, Est Non African American: 77 mL/min/{1.73_m2} (ref >=60)
GLUCOSE: 85 mg/dL (ref 65–99)
Potassium: 4.1 mmol/L (ref 3.5–5.3)
SODIUM: 138 mmol/L (ref 135–146)

## 2017-04-14 LAB — IRON, TOTAL/TOTAL IRON BINDING CAP
%SAT: 42 % (calc) (ref 11–50)
IRON: 157 ug/dL (ref 40–190)
TIBC: 373 ug/dL (ref 250–450)

## 2017-04-14 LAB — LIPID PANEL
CHOL/HDL RATIO: 2.2 (calc) (ref <5.0)
CHOLESTEROL: 177 mg/dL (ref <200)
HDL: 81 mg/dL (ref >50)
LDL Cholesterol (Calc): 82 mg/dL (calc)
NON-HDL CHOLESTEROL (CALC): 96 mg/dL (ref <130)
Triglycerides: 59 mg/dL (ref <150)

## 2017-04-14 LAB — HEMOGLOBIN A1C
HEMOGLOBIN A1C: 5 %{Hb} (ref <5.7)
MEAN PLASMA GLUCOSE: 97 (calc)
eAG (mmol/L): 5.4 (calc)

## 2017-04-14 LAB — MICROALBUMIN / CREATININE URINE RATIO
CREATININE, URINE: 42 mg/dL (ref 20–275)
MICROALB/CREAT RATIO: 5 ug/mg{creat} (ref <30)
Microalb, Ur: 0.2 mg/dL

## 2017-04-14 LAB — MAGNESIUM: Magnesium: 2.1 mg/dL (ref 1.5–2.5)

## 2017-04-14 LAB — INSULIN, RANDOM: Insulin: 4.2 u[IU]/mL (ref 2.0–19.6)

## 2017-04-14 LAB — VITAMIN B12: Vitamin B-12: 614 pg/mL (ref 200–1100)

## 2017-04-14 LAB — VITAMIN D 25 HYDROXY (VIT D DEFICIENCY, FRACTURES): Vit D, 25-Hydroxy: 84 ng/mL (ref 30–100)

## 2017-04-14 LAB — TSH: TSH: 0.93 m[IU]/L

## 2017-05-20 ENCOUNTER — Encounter (INDEPENDENT_AMBULATORY_CARE_PROVIDER_SITE_OTHER): Payer: Self-pay

## 2017-05-23 ENCOUNTER — Other Ambulatory Visit: Payer: Self-pay | Admitting: *Deleted

## 2017-05-23 DIAGNOSIS — Z1211 Encounter for screening for malignant neoplasm of colon: Secondary | ICD-10-CM

## 2017-05-23 DIAGNOSIS — Z1212 Encounter for screening for malignant neoplasm of rectum: Principal | ICD-10-CM

## 2017-05-23 LAB — POC HEMOCCULT BLD/STL (HOME/3-CARD/SCREEN)
Card #2 Fecal Occult Blod, POC: NEGATIVE
FECAL OCCULT BLD: NEGATIVE
Fecal Occult Blood, POC: NEGATIVE

## 2017-05-23 LAB — TB SKIN TEST
Induration: 0 mm
TB SKIN TEST: NEGATIVE

## 2018-04-24 ENCOUNTER — Ambulatory Visit
Admission: RE | Admit: 2018-04-24 | Discharge: 2018-04-24 | Disposition: A | Discharge status: Home / Self Care | Payer: BC Managed Care – PPO | Source: Ambulatory Visit | Attending: Sports Medicine | Admitting: Sports Medicine

## 2018-04-24 ENCOUNTER — Ambulatory Visit: Payer: BC Managed Care – PPO | Admitting: Sports Medicine

## 2018-04-24 VITALS — BP 106/70 | Ht 65.5 in | Wt 125.0 lb

## 2018-04-24 DIAGNOSIS — M79604 Pain in right leg: Secondary | ICD-10-CM

## 2018-04-24 NOTE — Progress Notes (Signed)
Subjective:    Patient ID: Danielle [name] is a 43 y.o.  Chief Complaint: R hip and leg pain HPI Danielle [name] is a 43yo with history of R Baker cyst who presents with 2-3 months of R hip and leg pain. She reports that upon walking significant distances (walking around a shopping mall, or around the block), she develops a sharp, achey pain in her R groin or lateral hip that radiates down to her inner thigh and outer lower leg and foot. She does not have any numbness or tingling with this, nor does she have back pain. She often needs to rest for about 20min until the pain abates. She has not been running since her baker cyst in May of 2018, instead uses the elliptical and recumbent bike. She has not noted any weakness, and has not had the pain with any other kinds of activity or at any other time of day. Recently she tried taking 3 Advil before walking around, and this did not alleviate the symptoms.   Social History   Occupational History  . Not on file  Tobacco Use  . Smoking status: Never Smoker  . Smokeless tobacco: Never Used  Substance and Sexual Activity  . Alcohol use: No    Alcohol/week: 0.0 standard drinks    Comment: rarely  . Drug use: No  . Sexual activity: Not on file    ROS   As above.    Objective:   Ortho Exam   R hip: benign inspection; no tenderness to palpation; full ROM without significant pain; negative trandelenberg L hip: benign inspection; no tenderness to palpation; full ROM without significant pain; negative trandelenberg  R leg: 2+ DP, complete sensation in lower extremity, 2+ patellar and achilles reflexes; full ROM and strength of knee and ankle L leg: 2+ DP, complete sensation in lower extremity, 2+ patellar and achilles reflexes; full ROM and strength of knee and ankle     Assessment:     1. 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 She is young for OA and the pain travels below the knee which is unusual. The dermatomal distribution points more  to nerve pain however the rest of the history is more consistent with OA. Will start with XR to evaluate. If no evidence of OA, will do PT for soft tissue etiology.      Plan:     - XR AP and lateral R hip - XR AP and lateral spine  Addendum: X-rays of both the lumbar spine and right hip are unremarkable.  I will refer the patient for physical therapy and she will follow-up with me again in 4 weeks.  If symptoms persist then consider further diagnostic imaging of the right hip.

## 2018-04-25 ENCOUNTER — Encounter: Payer: Self-pay | Admitting: Sports Medicine

## 2018-04-25 NOTE — Addendum Note (Signed)
Addended by: Jolinda Croak E on: 04/25/2018 01:54 PM   Modules accepted: Orders

## 2018-04-27 ENCOUNTER — Encounter: Payer: Self-pay | Admitting: Internal Medicine

## 2018-04-27 NOTE — Patient Instructions (Signed)
Preventive Care for Adults  A healthy lifestyle and preventive care can promote health and wellness. Preventive health guidelines for women include the following key practices.  A routine yearly physical is a good way to check with your health care provider about your health and preventive screening. It is a chance to share any concerns and updates on your health and to receive a thorough exam.  Visit your dentist for a routine exam and preventive care every 6 months. Brush your teeth twice a day and floss once a day. Good oral hygiene prevents tooth decay and gum disease.  The frequency of eye exams is based on your age, health, family medical history, use of contact lenses, and other factors. Follow your health care provider's recommendations for frequency of eye exams.  Eat a healthy diet. Foods like vegetables, fruits, whole grains, low-fat dairy products, and lean protein foods contain the nutrients you need without too many calories. Decrease your intake of foods high in solid fats, added sugars, and salt. Eat the right amount of calories for you. Get information about a proper diet from your health care provider, if necessary.  Regular physical exercise is one of the most important things you can do for your health. Most adults should get at least 150 minutes of moderate-intensity exercise (any activity that increases your heart rate and causes you to sweat) each week. In addition, most adults need muscle-strengthening exercises on 2 or more days a week.  Maintain a healthy weight. The body mass index (BMI) is a screening tool to identify possible weight problems. It provides an estimate of body fat based on height and weight. Your health care provider can find your BMI and can help you achieve or maintain a healthy weight. For adults 20 years and older:  A BMI below 18.5 is considered underweight.  A BMI of 18.5 to 24.9 is normal.  A BMI of 25 to 29.9 is considered overweight.  A BMI of  30 and above is considered obese.  Maintain normal blood lipids and cholesterol levels by exercising and minimizing your intake of saturated fat. Eat a balanced diet with plenty of fruit and vegetables. Blood tests for lipids and cholesterol should begin at age 53 and be repeated every 5 years. If your lipid or cholesterol levels are high, you are over 50, or you are at high risk for heart disease, you may need your cholesterol levels checked more frequently. Ongoing high lipid and cholesterol levels should be treated with medicines if diet and exercise are not working.  If you smoke, find out from your health care provider how to quit. If you do not use tobacco, do not start.  Lung cancer screening is recommended for adults aged 48-80 years who are at high risk for developing lung cancer because of a history of smoking. A yearly low-dose CT scan of the lungs is recommended for people who have at least a 30-pack-year history of smoking and are a current smoker or have quit within the past 15 years. A pack year of smoking is smoking an average of 1 pack of cigarettes a day for 1 year (for example: 1 pack a day for 30 years or 2 packs a day for 15 years). Yearly screening should continue until the smoker has stopped smoking for at least 15 years. Yearly screening should be stopped for people who develop a health problem that would prevent them from having lung cancer treatment.  High blood pressure causes heart disease and increases  the risk of stroke. Your blood pressure should be checked at least every 1 to 2 years. Ongoing high blood pressure should be treated with medicines if weight loss and exercise do not work.  If you are 63-27 years old, ask your health care provider if you should take aspirin to prevent strokes.  Diabetes screening involves taking a blood sample to check your fasting blood sugar level. This should be done once every 3 years, after age 54, if you are within normal weight and  without risk factors for diabetes. Testing should be considered at a younger age or be carried out more frequently if you are overweight and have at least 1 risk factor for diabetes.  Breast cancer screening is essential preventive care for women. You should practice "breast self-awareness." This means understanding the normal appearance and feel of your breasts and may include breast self-examination. Any changes detected, no matter how small, should be reported to a health care provider. Women in their 26s and 30s should have a clinical breast exam (CBE) by a health care provider as part of a regular health exam every 1 to 3 years. After age 84, women should have a CBE every year. Starting at age 56, women should consider having a mammogram (breast X-ray test) every year. Women who have a family history of breast cancer should talk to their health care provider about genetic screening. Women at a high risk of breast cancer should talk to their health care providers about having an MRI and a mammogram every year.  Breast cancer gene (BRCA)-related cancer risk assessment is recommended for women who have family members with BRCA-related cancers. BRCA-related cancers include breast, ovarian, tubal, and peritoneal cancers. Having family members with these cancers may be associated with an increased risk for harmful changes (mutations) in the breast cancer genes BRCA1 and BRCA2. Results of the assessment will determine the need for genetic counseling and BRCA1 and BRCA2 testing.  Routine pelvic exams to screen for cancer are no longer recommended for nonpregnant women who are considered low risk for cancer of the pelvic organs (ovaries, uterus, and vagina) and who do not have symptoms. Ask your health care provider if a screening pelvic exam is right for you.  If you have had past treatment for cervical cancer or a condition that could lead to cancer, you need Pap tests and screening for cancer for at least 20  years after your treatment. If Pap tests have been discontinued, your risk factors (such as having a new sexual partner) need to be reassessed to determine if screening should be resumed. Some women have medical problems that increase the chance of getting cervical cancer. In these cases, your health care provider may recommend more frequent screening and Pap tests.  Colorectal cancer can be detected and often prevented. Most routine colorectal cancer screening begins at the age of 63 years and continues through age 44 years. However, your health care provider may recommend screening at an earlier age if you have risk factors for colon cancer. On a yearly basis, your health care provider may provide home test kits to check for hidden blood in the stool. Use of a small camera at the end of a tube, to directly examine the colon (sigmoidoscopy or colonoscopy), can detect the earliest forms of colorectal cancer. Talk to your health care provider about this at age 68, when routine screening begins.  Direct exam of the colon should be repeated every 5-10 years through age 44 years, unless  early forms of pre-cancerous polyps or small growths are found.  Hepatitis C blood testing is recommended for all people born from 1945 through 1965 and any individual with known risks for hepatitis C.  Pra  Osteoporosis is a disease in which the bones lose minerals and strength with aging. This can result in serious bone fractures or breaks. The risk of osteoporosis can be identified using a bone density scan. Women ages 65 years and over and women at risk for fractures or osteoporosis should discuss screening with their health care providers. Ask your health care provider whether you should take a calcium supplement or vitamin D to reduce the rate of osteoporosis.  Menopause can be associated with physical symptoms and risks. Hormone replacement therapy is available to decrease symptoms and risks. You should talk to your  health care provider about whether hormone replacement therapy is right for you.  Use sunscreen. Apply sunscreen liberally and repeatedly throughout the day. You should seek shade when your shadow is shorter than you. Protect yourself by wearing long sleeves, pants, a wide-brimmed hat, and sunglasses year round, whenever you are outdoors.  Once a month, do a whole body skin exam, using a mirror to look at the skin on your back. Tell your health care provider of new moles, moles that have irregular borders, moles that are larger than a pencil eraser, or moles that have changed in shape or color.  Stay current with required vaccines (immunizations).  Influenza vaccine. All adults should be immunized every year.  Tetanus, diphtheria, and acellular pertussis (Td, Tdap) vaccine. Pregnant women should receive 1 dose of Tdap vaccine during each pregnancy. The dose should be obtained regardless of the length of time since the last dose. Immunization is preferred during the 27th-36th week of gestation. An adult who has not previously received Tdap or who does not know her vaccine status should receive 1 dose of Tdap. This initial dose should be followed by tetanus and diphtheria toxoids (Td) booster doses every 10 years. Adults with an unknown or incomplete history of completing a 3-dose immunization series with Td-containing vaccines should begin or complete a primary immunization series including a Tdap dose. Adults should receive a Td booster every 10 years.  Varicella vaccine. An adult without evidence of immunity to varicella should receive 2 doses or a second dose if she has previously received 1 dose. Pregnant females who do not have evidence of immunity should receive the first dose after pregnancy. This first dose should be obtained before leaving the health care facility. The second dose should be obtained 4-8 weeks after the first dose.  Human papillomavirus (HPV) vaccine. Females aged 13-26 years  who have not received the vaccine previously should obtain the 3-dose series. The vaccine is not recommended for use in pregnant females. However, pregnancy testing is not needed before receiving a dose. If a female is found to be pregnant after receiving a dose, no treatment is needed. In that case, the remaining doses should be delayed until after the pregnancy. Immunization is recommended for any person with an immunocompromised condition through the age of 26 years if she did not get any or all doses earlier. During the 3-dose series, the second dose should be obtained 4-8 weeks after the first dose. The third dose should be obtained 24 weeks after the first dose and 16 weeks after the second dose.  Zoster vaccine. One dose is recommended for adults aged 60 years or older unless certain conditions are present.    Measles, mumps, and rubella (MMR) vaccine. Adults born before [birth year] generally are considered immune to measles and mumps. Adults born in [birth year] or later should have 1 or more doses of MMR vaccine unless there is a contraindication to the vaccine or there is laboratory evidence of immunity to each of the three diseases. A routine second dose of MMR vaccine should be obtained at least 28 days after the first dose for students attending postsecondary schools, health care workers, or international travelers. People who received inactivated measles vaccine or an unknown type of measles vaccine during 1963-1967 should receive 2 doses of MMR vaccine. People who received inactivated mumps vaccine or an unknown type of mumps vaccine before 1979 and are at high risk for mumps infection should consider immunization with 2 doses of MMR vaccine. For females of childbearing age, rubella immunity should be determined. If there is no evidence of immunity, females who are not pregnant should be vaccinated. If there is no evidence of immunity, females who are pregnant should delay immunization until after pregnancy.  Unvaccinated health care workers born before [birth year] who lack laboratory evidence of measles, mumps, or rubella immunity or laboratory confirmation of disease should consider measles and mumps immunization with 2 doses of MMR vaccine or rubella immunization with 1 dose of MMR vaccine.  Pneumococcal 13-valent conjugate (PCV13) vaccine. When indicated, a person who is uncertain of her immunization history and has no record of immunization should receive the PCV13 vaccine. An adult aged 42 years or older who has certain medical conditions and has not been previously immunized should receive 1 dose of PCV13 vaccine. This PCV13 should be followed with a dose of pneumococcal polysaccharide (PPSV23) vaccine. The PPSV23 vaccine dose should be obtained at least 1 or more year(s) after the dose of PCV13 vaccine. An adult aged 46 years or older who has certain medical conditions and previously received 1 or more doses of PPSV23 vaccine should receive 1 dose of PCV13. The PCV13 vaccine dose should be obtained 1 or more years after the last PPSV23 vaccine dose.    Pneumococcal polysaccharide (PPSV23) vaccine. When PCV13 is also indicated, PCV13 should be obtained first. All adults aged 58 years and older should be immunized. An adult younger than age 54 years who has certain medical conditions should be immunized. Any person who resides in a nursing home or long-term care facility should be immunized. An adult smoker should be immunized. People with an immunocompromised condition and certain other conditions should receive both PCV13 and PPSV23 vaccines. People with human immunodeficiency virus (HIV) infection should be immunized as soon as possible after diagnosis. Immunization during chemotherapy or radiation therapy should be avoided. Routine use of PPSV23 vaccine is not recommended for American Indians, [place] Natives, or people younger than 65 years unless there are medical conditions that require PPSV23 vaccine. When  indicated, people who have unknown immunization and have no record of immunization should receive PPSV23 vaccine. One-time revaccination 5 years after the first dose of PPSV23 is recommended for people aged 19-64 years who have chronic kidney failure, nephrotic syndrome, asplenia, or immunocompromised conditions. People who received 1-2 doses of PPSV23 before age 97 years should receive another dose of PPSV23 vaccine at age 79 years or later if at least 5 years have passed since the previous dose. Doses of PPSV23 are not needed for people immunized with PPSV23 at or after age 39 years.  Preventive Services / Frequency   Ages 68 to 35 years  Blood pressure check.  Lipid and cholesterol check.  Lung cancer screening. / Every year if you are aged 79-80 years and have a 30-pack-year history of smoking and currently smoke or have quit within the past 15 years. Yearly screening is stopped once you have quit smoking for at least 15 years or develop a health problem that would prevent you from having lung cancer treatment.  Clinical breast exam.** / Every year after age 55 years.   BRCA-related cancer risk assessment.** / For women who have family members with a BRCA-related cancer (breast, ovarian, tubal, or peritoneal cancers).  Mammogram.** / Every year beginning at age 48 years and continuing for as long as you are in good health. Consult with your health care provider.  Pap test.** / Every 3 years starting at age 27 years through age 21 or 71 years with a history of 3 consecutive normal Pap tests.  HPV screening.** / Every 3 years from ages 70 years through ages 26 to 37 years with a history of 3 consecutive normal Pap tests.  Fecal occult blood test (FOBT) of stool. / Every year beginning at age 55 years and continuing until age 86 years. You may not need to do this test if you get a colonoscopy every 10 years.  Flexible sigmoidoscopy or colonoscopy.** / Every 5 years for a flexible  sigmoidoscopy or every 10 years for a colonoscopy beginning at age 26 years and continuing until age 76 years.  Hepatitis C blood test.** / For all people born from [birth year] through 1965 and any individual with known risks for hepatitis C.  Skin self-exam. / Monthly.  Influenza vaccine. / Every year.  Tetanus, diphtheria, and acellular pertussis (Tdap/Td) vaccine.** / Consult your health care provider. Pregnant women should receive 1 dose of Tdap vaccine during each pregnancy. 1 dose of Td every 10 years.  Varicella vaccine.** / Consult your health care provider. Pregnant females who do not have evidence of immunity should receive the first dose after pregnancy.  Zoster vaccine.** / 1 dose for adults aged 42 years or older.  Pneumococcal 13-valent conjugate (PCV13) vaccine.** / Consult your health care provider.  Pneumococcal polysaccharide (PPSV23) vaccine.** / 1 to 2 doses if you smoke cigarettes or if you have certain conditions.  Meningococcal vaccine.** / Consult your health care provider.  Hepatitis A vaccine.** / Consult your health care provider.  Hepatitis B vaccine.** / Consult your health care provider. Screening for abdominal aortic aneurysm (AAA)  by ultrasound is recommended for people over 50 who have history of high blood pressure or who are current or former smokers. ++++++++++++++++++ Recommend Adult Low Dose Aspirin or  coated  Aspirin 81 mg daily  To reduce risk of Colon Cancer 20 %,  Skin Cancer 26 % ,  Melanoma 46%  and  Pancreatic cancer 60% +++++++++++++++++++ Vitamin D goal  is between 70-100.  Please make sure that you are taking your Vitamin D as directed.  It is very important as a natural anti-inflammatory  helping hair, skin, and nails, as well as reducing stroke and heart attack risk.  It helps your bones and helps with mood. It also decreases numerous cancer risks so please take it as directed.  Low Vit D is associated with a 200-300% higher risk  for CANCER  and 200-300% higher risk for HEART   ATTACK  &  STROKE.   .....................................Marland Kitchen It is also associated with higher death rate at younger ages,  autoimmune diseases like Rheumatoid arthritis, Lupus, Multiple Sclerosis.  Also many other serious conditions, like depression, Alzheimer's Dementia, infertility, muscle aches, fatigue, fibromyalgia - just to name a few. ++++++++++++++++++ Recommend the book "The END of DIETING" by Dr Joel Fuhrman  & the book "The END of DIABETES " by Dr Joel Fuhrman At [employer] - get book & Audio CD's    Being diabetic has a  300% increased risk for heart attack, stroke, cancer, and alzheimer- type vascular dementia. It is very important that you work harder with diet by avoiding all foods that are white. Avoid white rice (brown & wild rice is OK), white potatoes (sweetpotatoes in moderation is OK), White bread or wheat bread or anything made out of white flour like bagels, donuts, rolls, buns, biscuits, cakes, pastries, cookies, pizza crust, and pasta (made from white flour & egg whites) - vegetarian pasta or spinach or wheat pasta is OK. Multigrain breads like Arnold's or Pepper[employer], or multigrain sandwich thins or flatbreads.  Diet, exercise and weight loss can reverse and cure diabetes in the early stages.  Diet, exercise and weight loss is very important in the control and prevention of complications of diabetes which affects every system in your body, ie. Brain - dementia/stroke, eyes - glaucoma/blindness, heart - heart attack/heart failure, kidneys - dialysis, stomach - gastric paralysis, intestines - malabsorption, nerves - severe painful neuritis, circulation - gangrene & loss of a leg(s), and finally cancer and Alzheimers.    I recommend avoid fried & greasy foods,  sweets/candy, white rice (brown or wild rice or Quinoa is OK), white potatoes (sweet potatoes are OK) - anything made from white flour - bagels, doughnuts, rolls, buns,  biscuits,white and wheat breads, pizza crust and traditional pasta made of white flour & egg white(vegetarian pasta or spinach or wheat pasta is OK).  Multi-grain bread is OK - like multi-grain flat bread or sandwich thins. Avoid alcohol in excess. Exercise is also important.    Eat all the vegetables you want - avoid meat, especially red meat and dairy - especially cheese.  Cheese is the most concentrated form of trans-fats which is the worst thing to clog up our arteries. Veggie cheese is OK which can be found in the fresh produce section at [employer] or [employer] or [employer]  ++++++++++++++++++++++ DASH Eating Plan  DASH stands for "Dietary Approaches to Stop Hypertension."   The DASH eating plan is a healthy eating plan that has been shown to reduce high blood pressure (hypertension). Additional health benefits may include reducing the risk of type 2 diabetes mellitus, heart disease, and stroke. The DASH eating plan may also help with weight loss. WHAT DO I NEED TO KNOW ABOUT THE DASH EATING PLAN? For the DASH eating plan, you will follow these general guidelines:  Choose foods with a percent daily value for sodium of less than 5% (as listed on the food label).  Use salt-free seasonings or herbs instead of table salt or sea salt.  Check with your health care provider or pharmacist before using salt substitutes.  Eat lower-sodium products, often labeled as "lower sodium" or "no salt added."  Eat fresh foods.  Eat more vegetables, fruits, and low-fat dairy products.  Choose whole grains. Look for the word "whole" as the first word in the ingredient list.  Choose fish   Limit sweets, desserts, sugars, and sugary drinks.  Choose heart-healthy fats.  Eat veggie cheese   Eat more home-cooked food and less restaurant, buffet, and fast food.  Limit fried foods.  Cook foods using   methods other than frying.  Limit canned vegetables. If you do use them, rinse them well to  decrease the sodium.  When eating at a restaurant, ask that your food be prepared with less salt, or no salt if possible.                      WHAT FOODS CAN I EAT? Read Dr Fara Olden Fuhrman's books on The End of Dieting & The End of Diabetes  Grains Whole grain or whole wheat bread. Brown rice. Whole grain or whole wheat pasta. Quinoa, bulgur, and whole grain cereals. Low-sodium cereals. Corn or whole wheat flour tortillas. Whole grain cornbread. Whole grain crackers. Low-sodium crackers.  Vegetables Fresh or frozen vegetables (raw, steamed, roasted, or grilled). Low-sodium or reduced-sodium tomato and vegetable juices. Low-sodium or reduced-sodium tomato sauce and paste. Low-sodium or reduced-sodium canned vegetables.   Fruits All fresh, canned (in natural juice), or frozen fruits.  Protein Products  All fish and seafood.  Dried beans, peas, or lentils. Unsalted nuts and seeds. Unsalted canned beans.  Dairy Low-fat dairy products, such as skim or 1% milk, 2% or reduced-fat cheeses, low-fat ricotta or cottage cheese, or plain low-fat yogurt. Low-sodium or reduced-sodium cheeses.  Fats and Oils Tub margarines without trans fats. Light or reduced-fat mayonnaise and salad dressings (reduced sodium). Avocado. Safflower, olive, or canola oils. Natural peanut or almond butter.  Other Unsalted popcorn and pretzels. The items listed above may not be a complete list of recommended foods or beverages. Contact your dietitian for more options.  ++++++++++++++++++  WHAT FOODS ARE NOT RECOMMENDED? Grains/ White flour or wheat flour White bread. White pasta. White rice. Refined cornbread. Bagels and croissants. Crackers that contain trans fat.  Vegetables  Creamed or fried vegetables. Vegetables in a . Regular canned vegetables. Regular canned tomato sauce and paste. Regular tomato and vegetable juices.  Fruits Dried fruits. Canned fruit in light or heavy syrup. Fruit juice.  Meat and Other  Protein Products Meat in general - RED meat & White meat.  Fatty cuts of meat. Ribs, chicken wings, all processed meats as bacon, sausage, bologna, salami, fatback, hot dogs, bratwurst and packaged luncheon meats.  Dairy Whole or 2% milk, cream, half-and-half, and cream cheese. Whole-fat or sweetened yogurt. Full-fat cheeses or blue cheese. Non-dairy creamers and whipped toppings. Processed cheese, cheese spreads, or cheese curds.  Condiments Onion and garlic salt, seasoned salt, table salt, and sea salt. Canned and packaged gravies. Worcestershire sauce. Tartar sauce. Barbecue sauce. Teriyaki sauce. Soy sauce, including reduced sodium. Steak sauce. Fish sauce. Oyster sauce. Cocktail sauce. Horseradish. Ketchup and mustard. Meat flavorings and tenderizers. Bouillon cubes. Hot sauce. Tabasco sauce. Marinades. Taco seasonings. Relishes.  Fats and Oils Butter, stick margarine, lard, shortening and bacon fat. Coconut, palm kernel, or palm oils. Regular salad dressings.  Pickles and olives. Salted popcorn and pretzels.  The items listed above may not be a complete list of foods and beverages to avoid.

## 2018-04-27 NOTE — Progress Notes (Signed)
[facility] Unk Pinto, M.D.     Uvaldo Bristle. Silverio Lay, P.A.-C Liane Comber, [place], [place] [address] Telephone [phone] Telefax [phone] Annual Screening/Preventative Visit & Comprehensive Evaluation &  Examination     This very nice 43 y.o.MWF presents for a Screening /Preventative Visit & comprehensive evaluation and management of multiple medical co-morbidities.  Patient has been followed for labile HTN, HLD, Prediabetes  and Vitamin D Deficiency. Patient has GERD controlled on her meds.      Patient has been followed expectantly for labile HTN. Patient's BP has been controlled and patient denies any cardiac symptoms as chest pain, palpitations, shortness of breath, dizziness or ankle swelling. Today's BP is at goal - 104/64.      Patient's hyperlipidemia is controlled with diet and medications. Patient denies myalgias or other medication SE's. Last lipids were  Lab Results  Component Value Date   CHOL 177 04/13/2017   HDL 81 04/13/2017   LDLCALC 82 04/13/2017   TRIG 59 04/13/2017   CHOLHDL 2.2 04/13/2017      Patient ois monitored expectantly for glucose intolerance and patient denies reactive hypoglycemic symptoms, visual blurring, diabetic polys or paresthesias. Last A1c was Normal & at goal: Lab Results  Component Value Date   HGBA1C 5.0 04/13/2017      Finally, patient has history of Vitamin D Deficiency  ("42" on tx / 2016) and last Vitamin D was  Lab Results  Component Value Date   VD25OH 50 04/13/2017   Current Outpatient Medications on File Prior to Visit  Medication Sig  . Cholecalciferol (VITAMIN D PO) Take 5,000 Units by mouth.  . co-enzyme Q-10 30 MG capsule Take 30 mg by mouth daily.  . fexofenadine (ALLEGRA ALLERGY) 180 MG tablet Take 180 mg by mouth daily.  . Fluticasone Propionate (FLONASE NA) Place into the nose daily.  Marland Kitchen MAGNESIUM PO Take 1  tablet by mouth daily.  . Multiple Vitamins-Minerals (MULTIVITAMIN ADULT PO) Take by mouth daily.  . Omega-3 Fatty Acids (FISH OIL PO) Take by mouth daily.  Marland Kitchen OVER THE COUNTER MEDICATION Tumeric 1 daily  . OVER THE COUNTER MEDICATION Hylands seasonal allergy relief(homeopathic) 1 daily  . Prenatal Vit-Fe Fumarate-FA (M-VIT PO) Take 1 tablet by mouth daily. OTC MVI  . ranitidine (ZANTAC) 75 MG tablet Take 75 mg by mouth. PRN   No current facility-administered medications on file prior to visit.    Allergies  Allergen Reactions  . Sulfa Antibiotics   . Levaquin [Levofloxacin In D5w] Rash   Past Medical History:  Diagnosis Date  . GERD (gastroesophageal reflux disease)   . Parvovirus infection 2013  . Shingles 2011  . UTI (lower urinary tract infection)   . Vitamin D deficiency    Health Maintenance  Topic Date Due  . PAP SMEAR-Modifier  04/20/1995  . INFLUENZA VACCINE  10/20/2017  . TETANUS/TDAP  06/22/2026  . HIV Screening  Completed   Immunization History  Administered Date(s) Administered  . Influenza Whole 12/30/2011  . Influenza, Seasonal, Injecte, Preservative Fre 02/05/2015  . Influenza-Unspecified 01/21/2014  . PPD Test 01/23/2013, 01/23/2014, 02/05/2015, 03/09/2016, 04/13/2017  . Pneumococcal Conjugate-13 12/30/2011  . Td 01/29/2006  . Tdap 06/21/2016   Last Colon -  Last MGM -  Past Surgical History:  Procedure Laterality Date  . ESOPHAGOGASTRODUODENOSCOPY ENDOSCOPY  2012   Family History  Problem Relation Age of Onset  . Cancer Maternal Grandmother  breast  . Cancer Maternal Grandfather        lung  . Cancer Paternal Grandfather        prostate  . Mitral valve prolapse Mother    Social History   Tobacco Use  . Smoking status: Never Smoker  . Smokeless tobacco: Never Used  Substance Use Topics  . Alcohol use: No    Alcohol/week: 0.0 standard drinks    Comment: rarely  . Drug use: No    ROS Constitutional: Denies fever, chills, weight  loss/gain, headaches, insomnia,  night sweats, and change in appetite. Does c/o fatigue. Eyes: Denies redness, blurred vision, diplopia, discharge, itchy, watery eyes.  ENT: Denies discharge, congestion, post nasal drip, epistaxis, sore throat, earache, hearing loss, dental pain, Tinnitus, Vertigo, Sinus pain, snoring.  Cardio: Denies chest pain, palpitations, irregular heartbeat, syncope, dyspnea, diaphoresis, orthopnea, PND, claudication, edema Respiratory: denies cough, dyspnea, DOE, pleurisy, hoarseness, laryngitis, wheezing.  Gastrointestinal: Denies dysphagia, heartburn, reflux, water brash, pain, cramps, nausea, vomiting, bloating, diarrhea, constipation, hematemesis, melena, hematochezia, jaundice, hemorrhoids Genitourinary: Denies dysuria, frequency, urgency, nocturia, hesitancy, discharge, hematuria, flank pain Breast: Breast lumps, nipple discharge, bleeding.  Musculoskeletal: Denies arthralgia, myalgia, stiffness, Jt. Swelling, pain, limp, and strain/sprain. Denies falls. Skin: Denies puritis, rash, hives, warts, acne, eczema, changing in skin lesion Neuro: No weakness, tremor, incoordination, spasms, paresthesia, pain Psychiatric: Denies confusion, memory loss, sensory loss. Denies Depression. Endocrine: Denies change in weight, skin, hair change, nocturia, and paresthesia, diabetic polys, visual blurring, hyper / hypo glycemic episodes.  Heme/Lymph: No excessive bleeding, bruising, enlarged lymph nodes.  Physical Exam  BP 104/64   Pulse 72   Temp 99 F (37.2 C)   Ht 5\' 6"  (1.676 m)   Wt 128 lb 6.4 oz (58.2 kg)   SpO2 98%   BMI 20.72 kg/m   General Appearance: Well nourished, well groomed and in no apparent distress.  Eyes: PERRLA, EOMs, conjunctiva no swelling or erythema, normal fundi and vessels. Sinuses: No frontal/maxillary tenderness ENT/Mouth: EACs patent / TMs  nl. Nares clear without erythema, swelling, mucoid exudates. Oral hygiene is good. No erythema, swelling,  or exudate. Tongue normal, non-obstructing. Tonsils not swollen or erythematous. Hearing normal.  Neck: Supple, thyroid not palpable. No bruits, nodes or JVD. Respiratory: Respiratory effort normal.  BS equal and clear bilateral without rales, rhonci, wheezing or stridor. Cardio: Heart sounds are normal with regular rate and rhythm and no murmurs, rubs or gallops. Peripheral pulses are normal and equal bilaterally without edema. No aortic or femoral bruits. Chest: symmetric with normal excursions and percussion. Breasts: Symmetric, without lumps, nipple discharge, retractions, or fibrocystic changes.  Abdomen: Flat, soft with bowel sounds active. Nontender, no guarding, rebound, hernias, masses, or organomegaly.  Lymphatics: Non tender without lymphadenopathy.  Genitourinary:  Musculoskeletal: Full ROM all peripheral extremities, joint stability, 5/5 strength, and normal gait. Skin: Warm and dry without rashes, lesions, cyanosis, clubbing or  ecchymosis.  Neuro: Cranial nerves intact, reflexes equal bilaterally. Normal muscle tone, no cerebellar symptoms. Sensation intact.  Pysch: Alert and oriented X 3, normal affect, Insight and Judgment appropriate.   Assessment and Plan  1. Annual Preventative Screening Examination            Patient was counseled in prudent diet to achieve/maintain BMI less than 25 for weight control, BP monitoring, regular exercise and medications. Discussed med's effects and SE's. Screening labs and tests as requested with regular follow-up as recommended. Over 40 minutes of exam, counseling, chart review and high complex critical decision making was  performed.

## 2018-04-28 ENCOUNTER — Encounter: Payer: Self-pay | Admitting: Internal Medicine

## 2018-04-28 ENCOUNTER — Ambulatory Visit (INDEPENDENT_AMBULATORY_CARE_PROVIDER_SITE_OTHER): Payer: BC Managed Care – PPO | Admitting: Internal Medicine

## 2018-04-28 VITALS — BP 104/64 | HR 72 | Temp 99.0°F | Ht 66.0 in | Wt 128.4 lb

## 2018-04-28 DIAGNOSIS — Z1211 Encounter for screening for malignant neoplasm of colon: Secondary | ICD-10-CM

## 2018-04-28 DIAGNOSIS — Z8249 Family history of ischemic heart disease and other diseases of the circulatory system: Secondary | ICD-10-CM | POA: Diagnosis not present

## 2018-04-28 DIAGNOSIS — Z136 Encounter for screening for cardiovascular disorders: Secondary | ICD-10-CM

## 2018-04-28 DIAGNOSIS — Z131 Encounter for screening for diabetes mellitus: Secondary | ICD-10-CM

## 2018-04-28 DIAGNOSIS — Z0001 Encounter for general adult medical examination with abnormal findings: Secondary | ICD-10-CM

## 2018-04-28 DIAGNOSIS — R5383 Other fatigue: Secondary | ICD-10-CM

## 2018-04-28 DIAGNOSIS — Z130 Encounter for screening for diseases of the blood and blood-forming organs and certain disorders involving the immune mechanism: Secondary | ICD-10-CM

## 2018-04-28 DIAGNOSIS — Z1389 Encounter for screening for other disorder: Secondary | ICD-10-CM

## 2018-04-28 DIAGNOSIS — Z13220 Encounter for screening for lipoid disorders: Secondary | ICD-10-CM | POA: Diagnosis not present

## 2018-04-28 DIAGNOSIS — R030 Elevated blood-pressure reading, without diagnosis of hypertension: Secondary | ICD-10-CM

## 2018-04-28 DIAGNOSIS — Z111 Encounter for screening for respiratory tuberculosis: Secondary | ICD-10-CM | POA: Diagnosis not present

## 2018-04-28 DIAGNOSIS — E559 Vitamin D deficiency, unspecified: Secondary | ICD-10-CM

## 2018-04-28 DIAGNOSIS — Z1329 Encounter for screening for other suspected endocrine disorder: Secondary | ICD-10-CM

## 2018-04-28 DIAGNOSIS — Z0000 Encounter for general adult medical examination without abnormal findings: Secondary | ICD-10-CM

## 2018-04-28 DIAGNOSIS — Z79899 Other long term (current) drug therapy: Secondary | ICD-10-CM

## 2018-04-28 DIAGNOSIS — K219 Gastro-esophageal reflux disease without esophagitis: Secondary | ICD-10-CM

## 2018-04-28 DIAGNOSIS — R7309 Other abnormal glucose: Secondary | ICD-10-CM

## 2018-04-28 DIAGNOSIS — Z1212 Encounter for screening for malignant neoplasm of rectum: Secondary | ICD-10-CM

## 2018-04-28 DIAGNOSIS — E7800 Pure hypercholesterolemia, unspecified: Secondary | ICD-10-CM

## 2018-04-28 MED ORDER — PREDNISONE 20 MG PO TABS: ORAL_TABLET | ORAL | 0 refills | Status: DC

## 2018-04-30 ENCOUNTER — Encounter: Payer: Self-pay | Admitting: Internal Medicine

## 2018-05-01 LAB — IRON, TOTAL/TOTAL IRON BINDING CAP
%SAT: 29 % (calc) (ref 16–45)
Iron: 113 ug/dL (ref 40–190)
TIBC: 395 mcg/dL (calc) (ref 250–450)

## 2018-05-01 LAB — COMPLETE METABOLIC PANEL WITH GFR
AG Ratio: 1.8 (calc) (ref 1.0–2.5)
ALT: 22 U/L (ref 6–29)
AST: 22 U/L (ref 10–30)
Albumin: 4.8 g/dL (ref 3.6–5.1)
Alkaline phosphatase (APISO): 51 U/L (ref 31–125)
BUN: 12 mg/dL (ref 7–25)
CO2: 27 mmol/L (ref 20–32)
Calcium: 9.7 mg/dL (ref 8.6–10.2)
Chloride: 102 mmol/L (ref 98–110)
Creat: 0.91 mg/dL (ref 0.50–1.10)
GFR, Est African American: 89 mL/min/{1.73_m2} (ref >=60)
GFR, Est Non African American: 77 mL/min/{1.73_m2} (ref >=60)
Globulin: 2.6 g/dL (calc) (ref 1.9–3.7)
Glucose, Bld: 78 mg/dL (ref 65–99)
Potassium: 4 mmol/L (ref 3.5–5.3)
Sodium: 137 mmol/L (ref 135–146)
Total Bilirubin: 0.8 mg/dL (ref 0.2–1.2)
Total Protein: 7.4 g/dL (ref 6.1–8.1)

## 2018-05-01 LAB — LIPID PANEL
CHOLESTEROL: 194 mg/dL (ref <200)
HDL: 85 mg/dL (ref >=50)
LDL CHOLESTEROL (CALC): 93 mg/dL
Non-HDL Cholesterol (Calc): 109 mg/dL (calc) (ref <130)
Total CHOL/HDL Ratio: 2.3 (calc) (ref <5.0)
Triglycerides: 73 mg/dL (ref <150)

## 2018-05-01 LAB — MICROALBUMIN / CREATININE URINE RATIO
CREATININE, URINE: 29 mg/dL (ref 20–275)
Microalb, Ur: <0.2 mg/dL

## 2018-05-01 LAB — CBC WITH DIFFERENTIAL/PLATELET
Absolute Monocytes: 534 cells/uL (ref 200–950)
Basophils Absolute: 29 cells/uL (ref 0–200)
Basophils Relative: 0.5 %
EOS ABS: 93 {cells}/uL (ref 15–500)
Eosinophils Relative: 1.6 %
HEMATOCRIT: 41.3 % (ref 35.0–45.0)
Hemoglobin: 14.3 g/dL (ref 11.7–15.5)
Lymphs Abs: 1415 cells/uL (ref 850–3900)
MCH: 32.9 pg (ref 27.0–33.0)
MCHC: 34.6 g/dL (ref 32.0–36.0)
MCV: 95.2 fL (ref 80.0–100.0)
MPV: 12.3 fL (ref 7.5–12.5)
Monocytes Relative: 9.2 %
Neutro Abs: 3729 cells/uL (ref 1500–7800)
Neutrophils Relative %: 64.3 %
Platelets: 205 10*3/uL (ref 140–400)
RBC: 4.34 10*6/uL (ref 3.80–5.10)
RDW: 11.1 % (ref 11.0–15.0)
Total Lymphocyte: 24.4 %
WBC: 5.8 10*3/uL (ref 3.8–10.8)

## 2018-05-01 LAB — URINALYSIS, ROUTINE W REFLEX MICROSCOPIC
BILIRUBIN URINE: NEGATIVE
GLUCOSE, UA: NEGATIVE
Hgb urine dipstick: NEGATIVE
Ketones, ur: NEGATIVE
Leukocytes, UA: NEGATIVE
Nitrite: NEGATIVE
Protein, ur: NEGATIVE
Specific Gravity, Urine: 1.007 (ref 1.001–1.03)
pH: 7 (ref 5.0–8.0)

## 2018-05-01 LAB — INSULIN, RANDOM: Insulin: 2.6 u[IU]/mL (ref 2.0–19.6)

## 2018-05-01 LAB — VITAMIN B12: Vitamin B-12: 975 pg/mL (ref 200–1100)

## 2018-05-01 LAB — HEMOGLOBIN A1C
Hgb A1c MFr Bld: 5 % of total Hgb (ref <5.7)
Mean Plasma Glucose: 97 (calc)
eAG (mmol/L): 5.4 (calc)

## 2018-05-01 LAB — MAGNESIUM: MAGNESIUM: 2 mg/dL (ref 1.5–2.5)

## 2018-05-01 LAB — VITAMIN D 25 HYDROXY (VIT D DEFICIENCY, FRACTURES): Vit D, 25-Hydroxy: 57 ng/mL (ref 30–100)

## 2018-05-01 LAB — TSH: TSH: 0.86 mIU/L

## 2018-05-11 ENCOUNTER — Other Ambulatory Visit: Payer: Self-pay

## 2018-05-11 DIAGNOSIS — Z1211 Encounter for screening for malignant neoplasm of colon: Secondary | ICD-10-CM | POA: Diagnosis not present

## 2018-05-11 DIAGNOSIS — Z1212 Encounter for screening for malignant neoplasm of rectum: Principal | ICD-10-CM

## 2018-05-11 LAB — POC HEMOCCULT BLD/STL (HOME/3-CARD/SCREEN)
Card #2 Fecal Occult Blod, POC: NEGATIVE
Card #3 Fecal Occult Blood, POC: NEGATIVE
Fecal Occult Blood, POC: NEGATIVE

## 2019-05-07 ENCOUNTER — Encounter: Payer: Self-pay | Admitting: Internal Medicine

## 2019-05-07 NOTE — Progress Notes (Signed)
Annual Screening/Preventative Visit & Comprehensive Evaluation &  Examination     This very nice 44 y.o. MWF  presents for a Screening /Preventative Visit & comprehensive evaluation and management of multiple medical co-morbidities.  Patient has been followed for HTN, HLD, Prediabetes  and Vitamin D Deficiency. Patient has hx/o GERD controlled on her OTC meds.     Today, patient relates hx/o 3-5 year hx/o left periorbital /frontal HA' s usually occurring about once a month and worse the 1st day and tapering off over 2-3 days - OTC's ~Tylenol and NSAID's help some. No aura sx's.      Patient has been monitored expectantly for Labile HTN. Patient's BP has been controlled at home and patient denies any cardiac symptoms as chest pain, palpitations, shortness of breath, dizziness or ankle swelling. Today's BP is at goal - 116/78.      Patient's lipids have been  controlled with diet. Last lipids were at goal:  Lab Results  Component Value Date   CHOL 194 04/28/2018   HDL 85 04/28/2018   LDLCALC 93 04/28/2018   TRIG 73 04/28/2018   CHOLHDL 2.3 04/28/2018       Patient has been monitored expectantly for glucose intolerance and patient denies reactive hypoglycemic symptoms, visual blurring, diabetic polys or paresthesias. Last A1c was Normal & at goal:  Lab Results  Component Value Date   HGBA1C 5.0 04/28/2018       Finally, patient has history of Vitamin D Deficiency("42" on tx / 2016)  and last Vitamin D was near goal:  Lab Results  Component Value Date   VD25OH 57 04/28/2018    Current Outpatient Medications on File Prior to Visit  Medication Sig  . Cholecalciferol (VITAMIN D PO) Take 5,000 Units by mouth.  . co-enzyme Q-10 30 MG capsule Take 30 mg by mouth daily.  . famotidine (PEPCID) 20 MG tablet Take 20 mg by mouth as needed for heartburn or indigestion. OTC  . fexofenadine (ALLEGRA ALLERGY) 180 MG tablet Take 180 mg by mouth daily.  . Fluticasone Propionate (FLONASE NA)  Place into the nose daily.  Marland Kitchen MAGNESIUM PO Take 1 tablet by mouth daily.  . Multiple Vitamins-Minerals (MULTIVITAMIN ADULT PO) Take by mouth daily.  . Omega-3 Fatty Acids (FISH OIL PO) Take by mouth daily.  Marland Kitchen OVER THE COUNTER MEDICATION Tumeric 1 daily  . OVER THE COUNTER MEDICATION Hylands seasonal allergy relief(homeopathic) 1 daily  . OVER THE COUNTER MEDICATION Women's Vitamin 1 daily.   No current facility-administered medications on file prior to visit.   Allergies  Allergen Reactions  . Sulfa Antibiotics   . Levaquin [Levofloxacin In D5w] Rash   Past Medical History:  Diagnosis Date  . GERD (gastroesophageal reflux disease)   . Parvovirus infection 2013  . Shingles 2011  . UTI (lower urinary tract infection)   . Vitamin D deficiency    Health Maintenance  Topic Date Due  . PAP SMEAR-Modifier  04/20/1995  . INFLUENZA VACCINE  10/21/2018  . TETANUS/TDAP  06/22/2026  . HIV Screening  Completed   Immunization History  Administered Date(s) Administered  . Influenza Whole 12/30/2011  . Influenza, Seasonal, Injecte, Preservative Fre 02/05/2015  . Influenza-Unspecified 01/21/2014  . PPD Test 01/23/2013, 01/23/2014, 02/05/2015, 03/09/2016, 04/13/2017, 04/28/2018, 05/08/2019  . Pneumococcal Conjugate-13 12/30/2011  . Td 01/29/2006  . Tdap 06/21/2016   Last MGM -   Past Surgical History:  Procedure Laterality Date  . ESOPHAGOGASTRODUODENOSCOPY ENDOSCOPY  2012   Family History  Problem Relation Age  of Onset  . Cancer Maternal Grandmother        breast  . Cancer Maternal Grandfather        lung  . Cancer Paternal Grandfather        prostate  . Mitral valve prolapse Mother    Social History   Tobacco Use  . Smoking status: Never Smoker  . Smokeless tobacco: Never Used  Substance Use Topics  . Alcohol use: No    Alcohol/week: 0.0 standard drinks    Comment: rarely  . Drug use: No    ROS Constitutional: Denies fever, chills, weight loss/gain, headaches,  insomnia,  night sweats, and change in appetite. Does c/o fatigue. Eyes: Denies redness, blurred vision, diplopia, discharge, itchy, watery eyes.  ENT: Denies discharge, congestion, post nasal drip, epistaxis, sore throat, earache, hearing loss, dental pain, Tinnitus, Vertigo, Sinus pain, snoring.  Cardio: Denies chest pain, palpitations, irregular heartbeat, syncope, dyspnea, diaphoresis, orthopnea, PND, claudication, edema Respiratory: denies cough, dyspnea, DOE, pleurisy, hoarseness, laryngitis, wheezing.  Gastrointestinal: Denies dysphagia, heartburn, reflux, water brash, pain, cramps, nausea, vomiting, bloating, diarrhea, constipation, hematemesis, melena, hematochezia, jaundice, hemorrhoids Genitourinary: Denies dysuria, frequency, urgency, nocturia, hesitancy, discharge, hematuria, flank pain Breast: Breast lumps, nipple discharge, bleeding.  Musculoskeletal: Denies arthralgia, myalgia, stiffness, Jt. Swelling, pain, limp, and strain/sprain. Denies falls. Skin: Denies puritis, rash, hives, warts, acne, eczema, changing in skin lesion Neuro: No weakness, tremor, incoordination, spasms, paresthesia, pain Psychiatric: Denies confusion, memory loss, sensory loss. Denies Depression. Endocrine: Denies change in weight, skin, hair change, nocturia, and paresthesia, diabetic polys, visual blurring, hyper / hypo glycemic episodes.  Heme/Lymph: No excessive bleeding, bruising, enlarged lymph nodes.  Physical Exam  BP 116/78   Pulse 72   Temp (!) 97.2 F (36.2 C)   Resp 16   Ht 5\' 6"  (1.676 m)   Wt 127 lb (57.6 kg)   BMI 20.50 kg/m   General Appearance: Well nourished, well groomed and in no apparent distress.  Eyes: PERRLA, EOMs, conjunctiva no swelling or erythema, normal fundi and vessels. Sinuses: No frontal/maxillary tenderness ENT/Mouth: EACs patent / TMs  nl. Nares clear without erythema, swelling, mucoid exudates. Oral hygiene is good. No erythema, swelling, or exudate. Tongue  normal, non-obstructing. Tonsils not swollen or erythematous. Hearing normal.  Neck: Supple, thyroid not palpable. No bruits, nodes or JVD. Respiratory: Respiratory effort normal.  BS equal and clear bilateral without rales, rhonci, wheezing or stridor. Cardio: Heart sounds are normal with regular rate and rhythm and no murmurs, rubs or gallops. Peripheral pulses are normal and equal bilaterally without edema. No aortic or femoral bruits. Chest: symmetric with normal excursions and percussion. Breasts: Symmetric, without lumps, nipple discharge, retractions, or fibrocystic changes.  Abdomen: Flat, soft with bowel sounds active. Nontender, no guarding, rebound, hernias, masses, or organomegaly.  Lymphatics: Non tender without lymphadenopathy.  Genitourinary:  Musculoskeletal: Full ROM all peripheral extremities, joint stability, 5/5 strength, and normal gait. Skin: Warm and dry without rashes, lesions, cyanosis, clubbing or  ecchymosis.  Neuro: Cranial nerves intact, reflexes equal bilaterally. Normal muscle tone, no cerebellar symptoms. Sensation intact.  Pysch: Alert and oriented X 3, normal affect, Insight and Judgment appropriate.   Assessment and Plan  1. Annual Preventative Screening Examination  2. Elevated BP without diagnosis of hypertension  - EKG 12-Lead - Urinalysis, Routine w reflex microscopic - Microalbumin / creatinine urine ratio - CBC with Differential/Platelet - COMPLETE METABOLIC PANEL WITH GFR - Magnesium - TSH  3. Elevated cholesterol  - EKG 12-Lead - Lipid panel -  TSH  4. Abnormal glucose  - EKG 12-Lead - Hemoglobin A1c - Insulin, random  5. Vitamin D deficiency  - VITAMIN D 25 Hydroxy   6. Screening examination for pulmonary tuberculosis  - TB Skin Test  7. Screening for ischemic heart disease  - EKG 12-Lead  8. FHx: heart disease  - EKG 12-Lead  9. Fatigue  - Iron,Total/Total Iron Binding Cap - Vitamin B12 - CBC with  Differential/Platelet - TSH  10. Medication management  - Urinalysis, Routine w reflex microscopic - Microalbumin / creatinine urine ratio - CBC with Differential/Platelet - COMPLETE METABOLIC PANEL WITH GFR - Magnesium - Lipid panel - TSH - Hemoglobin A1c - Insulin, random - VITAMIN D 25 Hydroxy  11. Screening for colorectal cancer  - POC Hemoccult Bld/Stl  12. Migraines, Common type   - Discussed trigger factors - patient declined daily medications  - Fioricet for 1st day use & try switching to gExcedrin /Migraine  - consider Verapamil 80 mg rescue /"pill in the pocket"        Patient was counseled in prudent diet to achieve/maintain BMI less than 25 for weight control, BP monitoring, regular exercise and medications. Discussed med's effects and SE's. Screening labs and tests as requested with regular follow-up as recommended. Over 40 minutes of exam, counseling, chart review and high complex critical decision making was performed.   Kirtland Bouchard, MD

## 2019-05-07 NOTE — Patient Instructions (Addendum)
Migraine Headache A migraine headache is an intense, throbbing pain on one side or both sides of the head. Migraine headaches may also cause other symptoms, such as nausea, vomiting, and sensitivity to light and noise. A migraine headache can last from 4 hours to 3 days. Talk with your doctor about what things may bring on (trigger) your migraine headaches. What are the causes? The exact cause of this condition is not known. However, a migraine may be caused when nerves in the brain become irritated and release chemicals that cause inflammation of blood vessels. This inflammation causes pain. This condition may be triggered or caused by:  Drinking alcohol.  Smoking.  Taking medicines, such as: ? Medicine used to treat chest pain (nitroglycerin). ? Birth control pills. ? Estrogen. ? Certain blood pressure medicines.  Eating or drinking products that contain nitrates, glutamate, aspartame, or tyramine. Aged cheeses, chocolate, or caffeine may also be triggers.  Doing physical activity. Other things that may trigger a migraine headache include:  Menstruation.  Pregnancy.  Hunger.  Stress.  Lack of sleep or too much sleep.  Weather changes.  Fatigue. What increases the risk? The following factors may make you more likely to experience migraine headaches:  Being a certain age. This condition is more common in people who are 35-1 years old.  Being female.  Having a family history of migraine headaches.  Being Caucasian.  Having a mental health condition, such as depression or anxiety.  Being obese. What are the signs or symptoms? The main symptom of this condition is pulsating or throbbing pain. This pain may:  Happen in any area of the head, such as on one side or both sides.  Interfere with daily activities.  Get worse with physical activity.  Get worse with exposure to bright lights or loud noises. Other symptoms may  include:  Nausea.  Vomiting.  Dizziness.  General sensitivity to bright lights, loud noises, or smells. Before you get a migraine headache, you may get warning signs (an aura). An aura may include:  Seeing flashing lights or having blind spots.  Seeing bright spots, halos, or zigzag lines.  Having tunnel vision or blurred vision.  Having numbness or a tingling feeling.  Having trouble talking.  Having muscle weakness. Some people have symptoms after a migraine headache (postdromal phase), such as:  Feeling tired.  Difficulty concentrating. How is this diagnosed? A migraine headache can be diagnosed based on:  Your symptoms.  A physical exam.  Tests, such as: ? CT scan or an MRI of the head. These imaging tests can help rule out other causes of headaches. ? Taking fluid from the spine (lumbar puncture) and analyzing it (cerebrospinal fluid analysis, or CSF analysis). How is this treated? This condition may be treated with medicines that:  Relieve pain.  Relieve nausea.  Prevent migraine headaches. Treatment for this condition may also include:  Acupuncture.  Lifestyle changes like avoiding foods that trigger migraine headaches.  Biofeedback.  Cognitive behavioral therapy. Follow these instructions at home: Medicines  Take over-the-counter and prescription medicines only as told by your health care provider.  Ask your health care provider if the medicine prescribed to you: ? Requires you to avoid driving or using heavy machinery. ? Can cause constipation. You may need to take these actions to prevent or treat constipation:  Drink enough fluid to keep your urine pale yellow.  Take over-the-counter or prescription medicines.  Eat foods that are high in fiber, such as beans, whole grains,  and fresh fruits and vegetables.  Limit foods that are high in fat and processed sugars, such as fried or sweet foods. Lifestyle  Do not drink alcohol.  Do not  use any products that contain nicotine or tobacco, such as cigarettes, e-cigarettes, and chewing tobacco. If you need help quitting, ask your health care provider.  Get at least 8 hours of sleep every night.  Find ways to manage stress, such as meditation, deep breathing, or yoga. General instructions      Keep a journal to find out what may trigger your migraine headaches. For example, write down: ? What you eat and drink. ? How much sleep you get. ? Any change to your diet or medicines.  If you have a migraine headache: ? Avoid things that make your symptoms worse, such as bright lights. ? It may help to lie down in a dark, quiet room. ? Do not drive or use heavy machinery. ? Ask your health care provider what activities are safe for you while you are experiencing symptoms.  Keep all follow-up visits as told by your health care provider. This is important. Contact a health care provider if:  You develop symptoms that are different or more severe than your usual migraine headache symptoms.  You have more than 15 headache days in one month. Get help right away if:  Your migraine headache becomes severe.  Your migraine headache lasts longer than 72 hours.  You have a fever.  You have a stiff neck.  You have vision loss.  Your muscles feel weak or like you cannot control them.  You start to lose your balance often.  You have trouble walking.  You faint.  You have a seizure. Summary  A migraine headache is an intense, throbbing pain on one side or both sides of the head. Migraines may also cause other symptoms, such as nausea, vomiting, and sensitivity to light and noise.  This condition may be treated with medicines and lifestyle changes. You may also need to avoid certain things that trigger a migraine headache.  Keep a journal to find out what may trigger your migraine headaches.  Contact your health care provider if you have more than 15 headache days in a  month or you develop symptoms that are different or more severe than your usual migraine headache symptoms. This information is not intended to replace advice given to you by your health care provider. Make sure you discuss any questions you have with your health care provider. Document Revised: 06/30/2018 Document Reviewed: 04/20/2018 Elsevier Patient Education  [place].  ++++++++++++++++++++++++ - Link to sign up at [employer] site for the Covid-19 vaccine   http://cline.com/   Or call [phone]  ++++++++++++++++++++++++++++++  Vit D  & Vit C 1,000 mg   are recommended to help protect  against the Covid-19 and other Corona viruses.    Also it's recommended  to take  Zinc 50 mg  to help  protect against the Covid-19   and best place to get  is also on [employer].com  and don't pay more than 6-8 cents /pill !  ================================ Coronavirus (COVID-19) Are you at risk?  Are you at risk for the Coronavirus (COVID-19)?  To be considered HIGH RISK for Coronavirus (COVID-19), you have to meet the following criteria:  . Traveled to [place], [place], [place], [place] or [place]; or in the [place] to [place], [place], [place]  . or [place];  and have fever, cough, and shortness of breath within the last 2 weeks of travel OR . Been in close contact with a person diagnosed with COVID-19 within the last 2 weeks and have  . fever, cough,and shortness of breath .  . IF YOU DO NOT MEET THESE CRITERIA, YOU ARE CONSIDERED LOW RISK FOR COVID-19.  What to do if you are HIGH RISK for COVID-19?  Marland Kitchen If you are having a medical emergency, call 911. . Seek medical care right away. Before you go to a doctor's office, urgent care or emergency department, .  call ahead and tell them about your recent travel, contact with  someone diagnosed with COVID-19  .  and your symptoms.  . You should receive instructions from your physician's office regarding next steps of care.  . When you arrive at healthcare provider, tell the healthcare staff immediately you have returned from  . visiting [place], [place], [place], [place] or [place]; or traveled in the [place] to [place], [place],  . [place] or [place] in the last two weeks or you have been in close contact with a person diagnosed with  . COVID-19 in the last 2 weeks.   . Tell the health care staff about your symptoms: fever, cough and shortness of breath. . After you have been seen by a medical provider, you will be either: o Tested for (COVID-19) and discharged home on quarantine except to seek medical care if  o symptoms worsen, and asked to  - Stay home and avoid contact with others until you get your results (4-5 days)  - Avoid travel on public transportation if possible (such as bus, train, or airplane) or o Sent to the Emergency Department by EMS for evaluation, COVID-19 testing  and  o possible admission depending on your condition and test results.  What to do if you are LOW RISK for COVID-19?  Reduce your risk of any infection by using the same precautions used for avoiding the common cold or flu:  Marland Kitchen Wash your hands often with soap and warm water for at least 20 seconds.  If soap and water are not readily available,  . use an alcohol-based hand sanitizer with at least 60% alcohol.  . If coughing or sneezing, cover your mouth and nose by coughing or sneezing into the elbow areas of your shirt or coat, .  into a tissue or into your sleeve (not your hands). . Avoid shaking hands with others and consider head nods or verbal greetings only. . Avoid touching your eyes, nose, or mouth with unwashed hands.  . Avoid close contact with people who are sick. . Avoid places or events with large numbers of people in one location, like concerts or sporting  events. . Carefully consider travel plans you have or are making. . If you are planning any travel outside or inside the [place], visit the CDC's Travelers' Health webpage for the latest health notices. . If you have some symptoms but not all symptoms, continue to monitor at home and seek medical attention  . if your symptoms worsen. . If you are having a medical emergency, call 911. >>>>>>>>>>>>>>>>>>>>>>> Preventive Care for Adults  A healthy lifestyle and preventive care can promote health and wellness. Preventive health guidelines for women include the following key practices.  A routine yearly physical is a good way to check with your health care provider about your health and preventive screening. It is a chance to share any concerns and  updates on your health and to receive a thorough exam.  Visit your dentist for a routine exam and preventive care every 6 months. Brush your teeth twice a day and floss once a day. Good oral hygiene prevents tooth decay and gum disease.  The frequency of eye exams is based on your age, health, family medical history, use of contact lenses, and other factors. Follow your health care provider's recommendations for frequency of eye exams.  Eat a healthy diet. Foods like vegetables, fruits, whole grains, low-fat dairy products, and lean protein foods contain the nutrients you need without too many calories. Decrease your intake of foods high in solid fats, added sugars, and salt. Eat the right amount of calories for you. Get information about a proper diet from your health care provider, if necessary.  Regular physical exercise is one of the most important things you can do for your health. Most adults should get at least 150 minutes of moderate-intensity exercise (any activity that increases your heart rate and causes you to sweat) each week. In addition, most adults need muscle-strengthening exercises on 2 or more days a week.  Maintain a healthy weight. The body  mass index (BMI) is a screening tool to identify possible weight problems. It provides an estimate of body fat based on height and weight. Your health care provider can find your BMI and can help you achieve or maintain a healthy weight. For adults 20 years and older:  A BMI below 18.5 is considered underweight.  A BMI of 18.5 to 24.9 is normal.  A BMI of 25 to 29.9 is considered overweight.  A BMI of 30 and above is considered obese.  Maintain normal blood lipids and cholesterol levels by exercising and minimizing your intake of saturated fat. Eat a balanced diet with plenty of fruit and vegetables. Blood tests for lipids and cholesterol should begin at age 75 and be repeated every 5 years. If your lipid or cholesterol levels are high, you are over 50, or you are at high risk for heart disease, you may need your cholesterol levels checked more frequently. Ongoing high lipid and cholesterol levels should be treated with medicines if diet and exercise are not working.  If you smoke, find out from your health care provider how to quit. If you do not use tobacco, do not start.  Lung cancer screening is recommended for adults aged 59-80 years who are at high risk for developing lung cancer because of a history of smoking. A yearly low-dose CT scan of the lungs is recommended for people who have at least a 30-pack-year history of smoking and are a current smoker or have quit within the past 15 years. A pack year of smoking is smoking an average of 1 pack of cigarettes a day for 1 year (for example: 1 pack a day for 30 years or 2 packs a day for 15 years). Yearly screening should continue until the smoker has stopped smoking for at least 15 years. Yearly screening should be stopped for people who develop a health problem that would prevent them from having lung cancer treatment.  High blood pressure causes heart disease and increases the risk of stroke. Your blood pressure should be checked at least every  1 to 2 years. Ongoing high blood pressure should be treated with medicines if weight loss and exercise do not work.  If you are 1-58 years old, ask your health care provider if you should take aspirin to prevent strokes.  Diabetes  screening involves taking a blood sample to check your fasting blood sugar level. This should be done once every 3 years, after age 65, if you are within normal weight and without risk factors for diabetes. Testing should be considered at a younger age or be carried out more frequently if you are overweight and have at least 1 risk factor for diabetes.  Breast cancer screening is essential preventive care for women. You should practice "breast self-awareness." This means understanding the normal appearance and feel of your breasts and may include breast self-examination. Any changes detected, no matter how small, should be reported to a health care provider. Women in their 39s and 30s should have a clinical breast exam (CBE) by a health care provider as part of a regular health exam every 1 to 3 years. After age 81, women should have a CBE every year. Starting at age 7, women should consider having a mammogram (breast X-ray test) every year. Women who have a family history of breast cancer should talk to their health care provider about genetic screening. Women at a high risk of breast cancer should talk to their health care providers about having an MRI and a mammogram every year.  Breast cancer gene (BRCA)-related cancer risk assessment is recommended for women who have family members with BRCA-related cancers. BRCA-related cancers include breast, ovarian, tubal, and peritoneal cancers. Having family members with these cancers may be associated with an increased risk for harmful changes (mutations) in the breast cancer genes BRCA1 and BRCA2. Results of the assessment will determine the need for genetic counseling and BRCA1 and BRCA2 testing.  Routine pelvic exams to screen  for cancer are no longer recommended for nonpregnant women who are considered low risk for cancer of the pelvic organs (ovaries, uterus, and vagina) and who do not have symptoms. Ask your health care provider if a screening pelvic exam is right for you.  If you have had past treatment for cervical cancer or a condition that could lead to cancer, you need Pap tests and screening for cancer for at least 20 years after your treatment. If Pap tests have been discontinued, your risk factors (such as having a new sexual partner) need to be reassessed to determine if screening should be resumed. Some women have medical problems that increase the chance of getting cervical cancer. In these cases, your health care provider may recommend more frequent screening and Pap tests.  Colorectal cancer can be detected and often prevented. Most routine colorectal cancer screening begins at the age of 58 years and continues through age 66 years. However, your health care provider may recommend screening at an earlier age if you have risk factors for colon cancer. On a yearly basis, your health care provider may provide home test kits to check for hidden blood in the stool. Use of a small camera at the end of a tube, to directly examine the colon (sigmoidoscopy or colonoscopy), can detect the earliest forms of colorectal cancer. Talk to your health care provider about this at age 49, when routine screening begins.  Direct exam of the colon should be repeated every 5-10 years through age 20 years, unless early forms of pre-cancerous polyps or small growths are found.  Hepatitis C blood testing is recommended for all people born from [birth year] through 1966 and any individual with known risks for hepatitis C.  Pra  Osteoporosis is a disease in which the bones lose minerals and strength with aging. This can result in serious bone  fractures or breaks. The risk of osteoporosis can be identified using a bone density scan. Women ages 12  years and over and women at risk for fractures or osteoporosis should discuss screening with their health care providers. Ask your health care provider whether you should take a calcium supplement or vitamin D to reduce the rate of osteoporosis.  Menopause can be associated with physical symptoms and risks. Hormone replacement therapy is available to decrease symptoms and risks. You should talk to your health care provider about whether hormone replacement therapy is right for you.  Use sunscreen. Apply sunscreen liberally and repeatedly throughout the day. You should seek shade when your shadow is shorter than you. Protect yourself by wearing long sleeves, pants, a wide-brimmed hat, and sunglasses year round, whenever you are outdoors.  Once a month, do a whole body skin exam, using a mirror to look at the skin on your back. Tell your health care provider of new moles, moles that have irregular borders, moles that are larger than a pencil eraser, or moles that have changed in shape or color.  Stay current with required vaccines (immunizations).  Influenza vaccine. All adults should be immunized every year.  Tetanus, diphtheria, and acellular pertussis (Td, Tdap) vaccine. Pregnant women should receive 1 dose of Tdap vaccine during each pregnancy. The dose should be obtained regardless of the length of time since the last dose. Immunization is preferred during the 27th-36th week of gestation. An adult who has not previously received Tdap or who does not know her vaccine status should receive 1 dose of Tdap. This initial dose should be followed by tetanus and diphtheria toxoids (Td) booster doses every 10 years. Adults with an unknown or incomplete history of completing a 3-dose immunization series with Td-containing vaccines should begin or complete a primary immunization series including a Tdap dose. Adults should receive a Td booster every 10 years.  Varicella vaccine. An adult without evidence of  immunity to varicella should receive 2 doses or a second dose if she has previously received 1 dose. Pregnant females who do not have evidence of immunity should receive the first dose after pregnancy. This first dose should be obtained before leaving the health care facility. The second dose should be obtained 4-8 weeks after the first dose.  Human papillomavirus (HPV) vaccine. Females aged 13-26 years who have not received the vaccine previously should obtain the 3-dose series. The vaccine is not recommended for use in pregnant females. However, pregnancy testing is not needed before receiving a dose. If a female is found to be pregnant after receiving a dose, no treatment is needed. In that case, the remaining doses should be delayed until after the pregnancy. Immunization is recommended for any person with an immunocompromised condition through the age of 52 years if she did not get any or all doses earlier. During the 3-dose series, the second dose should be obtained 4-8 weeks after the first dose. The third dose should be obtained 24 weeks after the first dose and 16 weeks after the second dose.  Zoster vaccine. One dose is recommended for adults aged 59 years or older unless certain conditions are present.  Measles, mumps, and rubella (MMR) vaccine. Adults born before [birth year] generally are considered immune to measles and mumps. Adults born in [birth year] or later should have 1 or more doses of MMR vaccine unless there is a contraindication to the vaccine or there is laboratory evidence of immunity to each of the three diseases. A routine second  dose of MMR vaccine should be obtained at least 28 days after the first dose for students attending postsecondary schools, health care workers, or international travelers. People who received inactivated measles vaccine or an unknown type of measles vaccine during 1963-1967 should receive 2 doses of MMR vaccine. People who received inactivated mumps vaccine or an unknown  type of mumps vaccine before 1979 and are at high risk for mumps infection should consider immunization with 2 doses of MMR vaccine. For females of childbearing age, rubella immunity should be determined. If there is no evidence of immunity, females who are not pregnant should be vaccinated. If there is no evidence of immunity, females who are pregnant should delay immunization until after pregnancy. Unvaccinated health care workers born before 18 who lack laboratory evidence of measles, mumps, or rubella immunity or laboratory confirmation of disease should consider measles and mumps immunization with 2 doses of MMR vaccine or rubella immunization with 1 dose of MMR vaccine.  Pneumococcal 13-valent conjugate (PCV13) vaccine. When indicated, a person who is uncertain of her immunization history and has no record of immunization should receive the PCV13 vaccine. An adult aged 72 years or older who has certain medical conditions and has not been previously immunized should receive 1 dose of PCV13 vaccine. This PCV13 should be followed with a dose of pneumococcal polysaccharide (PPSV23) vaccine. The PPSV23 vaccine dose should be obtained at least 1 or more year(s) after the dose of PCV13 vaccine. An adult aged 23 years or older who has certain medical conditions and previously received 1 or more doses of PPSV23 vaccine should receive 1 dose of PCV13. The PCV13 vaccine dose should be obtained 1 or more years after the last PPSV23 vaccine dose.    Pneumococcal polysaccharide (PPSV23) vaccine. When PCV13 is also indicated, PCV13 should be obtained first. All adults aged 29 years and older should be immunized. An adult younger than age 78 years who has certain medical conditions should be immunized. Any person who resides in a nursing home or long-term care facility should be immunized. An adult smoker should be immunized. People with an immunocompromised condition and certain other conditions should receive both  PCV13 and PPSV23 vaccines. People with human immunodeficiency virus (HIV) infection should be immunized as soon as possible after diagnosis. Immunization during chemotherapy or radiation therapy should be avoided. Routine use of PPSV23 vaccine is not recommended for American Indians, [place] Natives, or people younger than 65 years unless there are medical conditions that require PPSV23 vaccine. When indicated, people who have unknown immunization and have no record of immunization should receive PPSV23 vaccine. One-time revaccination 5 years after the first dose of PPSV23 is recommended for people aged 19-64 years who have chronic kidney failure, nephrotic syndrome, asplenia, or immunocompromised conditions. People who received 1-2 doses of PPSV23 before age 52 years should receive another dose of PPSV23 vaccine at age 27 years or later if at least 5 years have passed since the previous dose. Doses of PPSV23 are not needed for people immunized with PPSV23 at or after age 38 years.  Preventive Services / Frequency   Ages 36 to 51 years  Blood pressure check.  Lipid and cholesterol check.  Lung cancer screening. / Every year if you are aged 54-80 years and have a 30-pack-year history of smoking and currently smoke or have quit within the past 15 years. Yearly screening is stopped once you have quit smoking for at least 15 years or develop a health problem that would prevent  you from having lung cancer treatment.  Clinical breast exam.** / Every year after age 64 years.   BRCA-related cancer risk assessment.** / For women who have family members with a BRCA-related cancer (breast, ovarian, tubal, or peritoneal cancers).  Mammogram.** / Every year beginning at age 90 years and continuing for as long as you are in good health. Consult with your health care provider.  Pap test.** / Every 3 years starting at age 60 years through age 73 or 70 years with a history of 3 consecutive normal Pap tests.  HPV  screening.** / Every 3 years from ages 25 years through ages 61 to 66 years with a history of 3 consecutive normal Pap tests.  Fecal occult blood test (FOBT) of stool. / Every year beginning at age 55 years and continuing until age 59 years. You may not need to do this test if you get a colonoscopy every 10 years.  Flexible sigmoidoscopy or colonoscopy.** / Every 5 years for a flexible sigmoidoscopy or every 10 years for a colonoscopy beginning at age 39 years and continuing until age 48 years.  Hepatitis C blood test.** / For all people born from [birth year] through 1966 and any individual with known risks for hepatitis C.  Skin self-exam. / Monthly.  Influenza vaccine. / Every year.  Tetanus, diphtheria, and acellular pertussis (Tdap/Td) vaccine.** / Consult your health care provider. Pregnant women should receive 1 dose of Tdap vaccine during each pregnancy. 1 dose of Td every 10 years.  Varicella vaccine.** / Consult your health care provider. Pregnant females who do not have evidence of immunity should receive the first dose after pregnancy.  Zoster vaccine.** / 1 dose for adults aged 36 years or older.  Pneumococcal 13-valent conjugate (PCV13) vaccine.** / Consult your health care provider.  Pneumococcal polysaccharide (PPSV23) vaccine.** / 1 to 2 doses if you smoke cigarettes or if you have certain conditions.  Meningococcal vaccine.** / Consult your health care provider.  Hepatitis A vaccine.** / Consult your health care provider.  Hepatitis B vaccine.** / Consult your health care provider. Screening for abdominal aortic aneurysm (AAA)  by ultrasound is recommended for people over 50 who have history of high blood pressure or who are current or former smokers. ++++++++++++++++++ Recommend Adult Low Dose Aspirin or  coated  Aspirin 81 mg daily  To reduce risk of Colon Cancer 40 %,  Skin Cancer 26 % ,  Melanoma 46%  and  Pancreatic cancer 60% +++++++++++++++++++ Vitamin D goal   is between 70-100.  Please make sure that you are taking your Vitamin D as directed.  It is very important as a natural anti-inflammatory  helping hair, skin, and nails, as well as reducing stroke and heart attack risk.  It helps your bones and helps with mood. It also decreases numerous cancer risks so please take it as directed.  Low Vit D is associated with a 200-300% higher risk for CANCER  and 200-300% higher risk for HEART   ATTACK  &  STROKE.   .....................................Marland Kitchen It is also associated with higher death rate at younger ages,  autoimmune diseases like Rheumatoid arthritis, Lupus, Multiple Sclerosis.    Also many other serious conditions, like depression, Alzheimer's Dementia, infertility, muscle aches, fatigue, fibromyalgia - just to name a few. ++++++++++++++++++ Recommend the book "The END of DIETING" by Dr Excell Seltzer  & the book "The END of DIABETES " by Dr Excell Seltzer At [email or web address] - get book & Audio CD's    Being  diabetic has a  300% increased risk for heart attack, stroke, cancer, and alzheimer- type vascular dementia. It is very important that you work harder with diet by avoiding all foods that are white. Avoid white rice (brown & wild rice is OK), white potatoes (sweetpotatoes in moderation is OK), White bread or wheat bread or anything made out of white flour like bagels, donuts, rolls, buns, biscuits, cakes, pastries, cookies, pizza crust, and pasta (made from white flour & egg whites) - vegetarian pasta or spinach or wheat pasta is OK. Multigrain breads like Arnold's or Pepperidge [employer], or multigrain sandwich thins or flatbreads.  Diet, exercise and weight loss can reverse and cure diabetes in the early stages.  Diet, exercise and weight loss is very important in the control and prevention of complications of diabetes which affects every system in your body, ie. Brain - dementia/stroke, eyes - glaucoma/blindness, heart - heart attack/heart failure, kidneys -  dialysis, stomach - gastric paralysis, intestines - malabsorption, nerves - severe painful neuritis, circulation - gangrene & loss of a leg(s), and finally cancer and Alzheimers.    I recommend avoid fried & greasy foods,  sweets/candy, white rice (brown or wild rice or Quinoa is OK), white potatoes (sweet potatoes are OK) - anything made from white flour - bagels, doughnuts, rolls, buns, biscuits,white and wheat breads, pizza crust and traditional pasta made of white flour & egg white(vegetarian pasta or spinach or wheat pasta is OK).  Multi-grain bread is OK - like multi-grain flat bread or sandwich thins. Avoid alcohol in excess. Exercise is also important.    Eat all the vegetables you want - avoid meat, especially red meat and dairy - especially cheese.  Cheese is the most concentrated form of trans-fats which is the worst thing to clog up our arteries. Veggie cheese is OK which can be found in the fresh produce section at [employer] or [employer] or [employer]  ++++++++++++++++++++++ DASH Eating Plan  DASH stands for "Dietary Approaches to Stop Hypertension."   The DASH eating plan is a healthy eating plan that has been shown to reduce high blood pressure (hypertension). Additional health benefits may include reducing the risk of type 2 diabetes mellitus, heart disease, and stroke. The DASH eating plan may also help with weight loss. WHAT DO I NEED TO KNOW ABOUT THE DASH EATING PLAN? For the DASH eating plan, you will follow these general guidelines:  Choose foods with a percent daily value for sodium of less than 5% (as listed on the food label).  Use salt-free seasonings or herbs instead of table salt or sea salt.  Check with your health care provider or pharmacist before using salt substitutes.  Eat lower-sodium products, often labeled as "lower sodium" or "no salt added."  Eat fresh foods.  Eat more vegetables, fruits, and low-fat dairy products.  Choose whole grains. Look for  the word "whole" as the first word in the ingredient list.  Choose fish   Limit sweets, desserts, sugars, and sugary drinks.  Choose heart-healthy fats.  Eat veggie cheese   Eat more home-cooked food and less restaurant, buffet, and fast food.  Limit fried foods.  Cook foods using methods other than frying.  Limit canned vegetables. If you do use them, rinse them well to decrease the sodium.  When eating at a restaurant, ask that your food be prepared with less salt, or no salt if possible.  WHAT FOODS CAN I EAT? Read Dr Fara Olden Fuhrman's books on The End of Dieting & The End of Diabetes  Grains Whole grain or whole wheat bread. Brown rice. Whole grain or whole wheat pasta. Quinoa, bulgur, and whole grain cereals. Low-sodium cereals. Corn or whole wheat flour tortillas. Whole grain cornbread. Whole grain crackers. Low-sodium crackers.  Vegetables Fresh or frozen vegetables (raw, steamed, roasted, or grilled). Low-sodium or reduced-sodium tomato and vegetable juices. Low-sodium or reduced-sodium tomato sauce and paste. Low-sodium or reduced-sodium canned vegetables.   Fruits All fresh, canned (in natural juice), or frozen fruits.  Protein Products  All fish and seafood.  Dried beans, peas, or lentils. Unsalted nuts and seeds. Unsalted canned beans.  Dairy Low-fat dairy products, such as skim or 1% milk, 2% or reduced-fat cheeses, low-fat ricotta or cottage cheese, or plain low-fat yogurt. Low-sodium or reduced-sodium cheeses.  Fats and Oils Tub margarines without trans fats. Light or reduced-fat mayonnaise and salad dressings (reduced sodium). Avocado. Safflower, olive, or canola oils. Natural peanut or almond butter.  Other Unsalted popcorn and pretzels. The items listed above may not be a complete list of recommended foods or beverages. Contact your dietitian for more options.  ++++++++++++++++++  WHAT FOODS ARE NOT RECOMMENDED? Grains/ White flour or  wheat flour White bread. White pasta. White rice. Refined cornbread. Bagels and croissants. Crackers that contain trans fat.  Vegetables  Creamed or fried vegetables. Vegetables in a . Regular canned vegetables. Regular canned tomato sauce and paste. Regular tomato and vegetable juices.  Fruits Dried fruits. Canned fruit in light or heavy syrup. Fruit juice.  Meat and Other Protein Products Meat in general - RED meat & White meat.  Fatty cuts of meat. Ribs, chicken wings, all processed meats as bacon, sausage, bologna, salami, fatback, hot dogs, bratwurst and packaged luncheon meats.  Dairy Whole or 2% milk, cream, half-and-half, and cream cheese. Whole-fat or sweetened yogurt. Full-fat cheeses or blue cheese. Non-dairy creamers and whipped toppings. Processed cheese, cheese spreads, or cheese curds.  Condiments Onion and garlic salt, seasoned salt, table salt, and sea salt. Canned and packaged gravies. Worcestershire sauce. Tartar sauce. Barbecue sauce. Teriyaki sauce. Soy sauce, including reduced sodium. Steak sauce. Fish sauce. Oyster sauce. Cocktail sauce. Horseradish. Ketchup and mustard. Meat flavorings and tenderizers. Bouillon cubes. Hot sauce. Tabasco sauce. Marinades. Taco seasonings. Relishes.  Fats and Oils Butter, stick margarine, lard, shortening and bacon fat. Coconut, palm kernel, or palm oils. Regular salad dressings.  Pickles and olives. Salted popcorn and pretzels.  The items listed above may not be a complete list of foods and beverages to avoid.

## 2019-05-08 ENCOUNTER — Encounter: Payer: Self-pay | Admitting: Internal Medicine

## 2019-05-08 ENCOUNTER — Other Ambulatory Visit: Payer: Self-pay

## 2019-05-08 ENCOUNTER — Ambulatory Visit (INDEPENDENT_AMBULATORY_CARE_PROVIDER_SITE_OTHER): Payer: BC Managed Care – PPO | Admitting: Internal Medicine

## 2019-05-08 VITALS — BP 116/78 | HR 72 | Temp 97.2°F | Resp 16 | Ht 66.0 in | Wt 127.0 lb

## 2019-05-08 DIAGNOSIS — E559 Vitamin D deficiency, unspecified: Secondary | ICD-10-CM

## 2019-05-08 DIAGNOSIS — R030 Elevated blood-pressure reading, without diagnosis of hypertension: Secondary | ICD-10-CM | POA: Diagnosis not present

## 2019-05-08 DIAGNOSIS — Z136 Encounter for screening for cardiovascular disorders: Secondary | ICD-10-CM | POA: Diagnosis not present

## 2019-05-08 DIAGNOSIS — Z111 Encounter for screening for respiratory tuberculosis: Secondary | ICD-10-CM

## 2019-05-08 DIAGNOSIS — Z79899 Other long term (current) drug therapy: Secondary | ICD-10-CM

## 2019-05-08 DIAGNOSIS — Z130 Encounter for screening for diseases of the blood and blood-forming organs and certain disorders involving the immune mechanism: Secondary | ICD-10-CM

## 2019-05-08 DIAGNOSIS — Z0000 Encounter for general adult medical examination without abnormal findings: Secondary | ICD-10-CM

## 2019-05-08 DIAGNOSIS — Z1329 Encounter for screening for other suspected endocrine disorder: Secondary | ICD-10-CM | POA: Diagnosis not present

## 2019-05-08 DIAGNOSIS — E782 Mixed hyperlipidemia: Secondary | ICD-10-CM

## 2019-05-08 DIAGNOSIS — Z131 Encounter for screening for diabetes mellitus: Secondary | ICD-10-CM | POA: Diagnosis not present

## 2019-05-08 DIAGNOSIS — Z13220 Encounter for screening for lipoid disorders: Secondary | ICD-10-CM | POA: Diagnosis not present

## 2019-05-08 DIAGNOSIS — Z1211 Encounter for screening for malignant neoplasm of colon: Secondary | ICD-10-CM

## 2019-05-08 DIAGNOSIS — Z1389 Encounter for screening for other disorder: Secondary | ICD-10-CM

## 2019-05-08 DIAGNOSIS — Z8249 Family history of ischemic heart disease and other diseases of the circulatory system: Secondary | ICD-10-CM

## 2019-05-08 DIAGNOSIS — R5383 Other fatigue: Secondary | ICD-10-CM

## 2019-05-08 DIAGNOSIS — Z0001 Encounter for general adult medical examination with abnormal findings: Secondary | ICD-10-CM

## 2019-05-08 DIAGNOSIS — G43009 Migraine without aura, not intractable, without status migrainosus: Secondary | ICD-10-CM

## 2019-05-08 DIAGNOSIS — R7309 Other abnormal glucose: Secondary | ICD-10-CM

## 2019-05-08 MED ORDER — BUTALBITAL-APAP-CAFFEINE 50-325-40 MG PO TABS: ORAL_TABLET | ORAL | 0 refills | Status: DC

## 2019-05-09 LAB — MICROALBUMIN / CREATININE URINE RATIO
Creatinine, Urine: 17 mg/dL — ABNORMAL LOW (ref 20–275)
Microalb Creat Ratio: 12 mcg/mg creat (ref <30)
Microalb, Ur: 0.2 mg/dL

## 2019-05-09 LAB — CBC WITH DIFFERENTIAL/PLATELET
Absolute Monocytes: 408 cells/uL (ref 200–950)
Basophils Absolute: 20 cells/uL (ref 0–200)
Basophils Relative: 0.4 %
Eosinophils Absolute: 128 cells/uL (ref 15–500)
Eosinophils Relative: 2.5 %
HCT: 39.5 % (ref 35.0–45.0)
Hemoglobin: 13.5 g/dL (ref 11.7–15.5)
Lymphs Abs: 1301 cells/uL (ref 850–3900)
MCH: 32.9 pg (ref 27.0–33.0)
MCHC: 34.2 g/dL (ref 32.0–36.0)
MCV: 96.3 fL (ref 80.0–100.0)
MPV: 12.3 fL (ref 7.5–12.5)
Monocytes Relative: 8 %
Neutro Abs: 3244 cells/uL (ref 1500–7800)
Neutrophils Relative %: 63.6 %
Platelets: 185 10*3/uL (ref 140–400)
RBC: 4.1 10*6/uL (ref 3.80–5.10)
RDW: 11 % (ref 11.0–15.0)
Total Lymphocyte: 25.5 %
WBC: 5.1 10*3/uL (ref 3.8–10.8)

## 2019-05-09 LAB — COMPLETE METABOLIC PANEL WITH GFR
AG Ratio: 2 (calc) (ref 1.0–2.5)
ALT: 13 U/L (ref 6–29)
AST: 13 U/L (ref 10–35)
Albumin: 4.4 g/dL (ref 3.6–5.1)
Alkaline phosphatase (APISO): 42 U/L (ref 31–125)
BUN: 16 mg/dL (ref 7–25)
CO2: 27 mmol/L (ref 20–32)
Calcium: 9.5 mg/dL (ref 8.6–10.2)
Chloride: 104 mmol/L (ref 98–110)
Creat: 0.8 mg/dL (ref 0.50–1.10)
GFR, Est African American: 103 mL/min/{1.73_m2} (ref >=60)
GFR, Est Non African American: 89 mL/min/{1.73_m2} (ref >=60)
Globulin: 2.2 g/dL (calc) (ref 1.9–3.7)
Glucose, Bld: 86 mg/dL (ref 65–99)
Potassium: 4 mmol/L (ref 3.5–5.3)
Sodium: 137 mmol/L (ref 135–146)
Total Bilirubin: 0.8 mg/dL (ref 0.2–1.2)
Total Protein: 6.6 g/dL (ref 6.1–8.1)

## 2019-05-09 LAB — LIPID PANEL
Cholesterol: 191 mg/dL (ref <200)
HDL: 74 mg/dL (ref >=50)
LDL Cholesterol (Calc): 98 mg/dL (calc)
Non-HDL Cholesterol (Calc): 117 mg/dL (calc) (ref <130)
Total CHOL/HDL Ratio: 2.6 (calc) (ref <5.0)
Triglycerides: 93 mg/dL (ref <150)

## 2019-05-09 LAB — URINALYSIS, ROUTINE W REFLEX MICROSCOPIC
Bacteria, UA: NONE SEEN /HPF
Bilirubin Urine: NEGATIVE
Glucose, UA: NEGATIVE
Hyaline Cast: NONE SEEN /LPF
Ketones, ur: NEGATIVE
Leukocytes,Ua: NEGATIVE
Nitrite: NEGATIVE
Protein, ur: NEGATIVE
Specific Gravity, Urine: 1.007 (ref 1.001–1.03)
Squamous Epithelial / HPF: NONE SEEN /HPF (ref <=5)
WBC, UA: NONE SEEN /HPF (ref 0–5)
pH: 7.5 (ref 5.0–8.0)

## 2019-05-09 LAB — HEMOGLOBIN A1C
Hgb A1c MFr Bld: 5 % of total Hgb (ref <5.7)
Mean Plasma Glucose: 97 (calc)
eAG (mmol/L): 5.4 (calc)

## 2019-05-09 LAB — IRON, TOTAL/TOTAL IRON BINDING CAP
%SAT: 33 % (calc) (ref 16–45)
Iron: 119 ug/dL (ref 40–190)
TIBC: 356 mcg/dL (calc) (ref 250–450)

## 2019-05-09 LAB — TSH: TSH: 1.08 mIU/L

## 2019-05-09 LAB — VITAMIN D 25 HYDROXY (VIT D DEFICIENCY, FRACTURES): Vit D, 25-Hydroxy: 65 ng/mL (ref 30–100)

## 2019-05-09 LAB — INSULIN, RANDOM: Insulin: 5.2 u[IU]/mL

## 2019-05-09 LAB — VITAMIN B12: Vitamin B-12: 765 pg/mL (ref 200–1100)

## 2019-05-09 LAB — MAGNESIUM: Magnesium: 2 mg/dL (ref 1.5–2.5)

## 2019-05-11 LAB — TB SKIN TEST
Induration: 0 mm
TB Skin Test: NEGATIVE

## 2019-05-21 ENCOUNTER — Encounter: Payer: Self-pay | Admitting: Internal Medicine

## 2019-05-28 ENCOUNTER — Other Ambulatory Visit: Payer: Self-pay

## 2019-05-28 DIAGNOSIS — Z1211 Encounter for screening for malignant neoplasm of colon: Secondary | ICD-10-CM

## 2019-05-28 LAB — POC HEMOCCULT BLD/STL (HOME/3-CARD/SCREEN)
Card #2 Fecal Occult Blod, POC: NEGATIVE
Card #3 Fecal Occult Blood, POC: NEGATIVE
Fecal Occult Blood, POC: NEGATIVE

## 2019-05-29 DIAGNOSIS — Z1212 Encounter for screening for malignant neoplasm of rectum: Secondary | ICD-10-CM | POA: Diagnosis not present

## 2019-05-29 DIAGNOSIS — Z1211 Encounter for screening for malignant neoplasm of colon: Secondary | ICD-10-CM

## 2019-06-25 NOTE — Progress Notes (Signed)
   History of Present Illness:    This very nice 44 yo MWF with an essentially negative PMH had a witnessed episode of near syncope 4 days ago without complete LOC lasting seconds.  Denies antecedent CP, dyspnea palpitations, nausea, GI or GU Sx's. There was no reported tongue biting, incontinence or reported injury. Patient denies prior episodes.  Medications  .  ALLEGRA 180 MG tablet, Take 180 mgdaily .  FLONASE , Place into the nose daily .  FIORICET MG tablet, Take 1 to 2 tablets every 4 to 6 hours for Migraine .  VITAMIN D, Take 5,000 Units daily .  co-enzyme Q-10 30 MG capsule, Take  daily .  famotidine 20 MG tablet, Take 20 mg  as needed for heartburn .  MAGNESIUM, Take 1 tablet daily .  MultiVits-Minerals, Take  daily .  Omega-3 FISH OIL , Take daily .  Tumeric 1 daily .  O'Hylands seasonal allergy relief (homeopathic) 1 daily .  Women's Vitamin 1 daily  Allergies  Allergen Reactions  . Sulfa Antibiotics   . Levaquin [Levofloxacin In D5w] Rash    Problem list She has Vitamin D deficiency; GERD (gastroesophageal reflux disease); Body mass index (BMI) of 20.0-20.9 in adult; Medication management; and Baker's cyst of knee, right on their problem list.   Observations/Objective:  BP 122/74-sit & 108/78-stand  P 72   T 97.4 F  R16   Ht 5\' 6"   Wt 125 lb BMI 20.24   Recheck Postural - Sit BP 105/67    P 68      &       Stand  BP 110/68    P 73.  HEENT - WNL. Neck - supple.  Chest - Clear equal BS. Cor - Nl HS. RRR w/o sig MGR. PP 1(+). No edema. MS- FROM w/o deformities.  Gait Nl. Neuro -  Nl w/o focal abnormalities.  Assessment and Plan:  1. Near syncope  - CBC with Differential/Platelet - COMPLETE METABOLIC PANEL WITH GFR - TSH - EKG 12-Lead -> WNL       I discussed the assessment and treatment plan with the patient. The patient was provided an opportunity to ask questions and all were answered. The patient agreed with the plan and demonstrated an  understanding of the instructions.       The patient was advised to call back or seek an in-person evaluation if the symptoms reoccur or worsen.    Kirtland Bouchard, MD

## 2019-06-26 ENCOUNTER — Other Ambulatory Visit: Payer: Self-pay

## 2019-06-26 ENCOUNTER — Encounter: Payer: Self-pay | Admitting: Internal Medicine

## 2019-06-26 ENCOUNTER — Ambulatory Visit: Payer: BC Managed Care – PPO | Admitting: Internal Medicine

## 2019-06-26 VITALS — BP 122/74 | HR 72 | Temp 97.4°F | Resp 16 | Ht 66.0 in | Wt 125.4 lb

## 2019-06-26 DIAGNOSIS — R55 Syncope and collapse: Secondary | ICD-10-CM | POA: Diagnosis not present

## 2019-06-26 DIAGNOSIS — Z79899 Other long term (current) drug therapy: Secondary | ICD-10-CM

## 2019-06-26 NOTE — Patient Instructions (Signed)
Syncope  Syncope refers to a condition in which a person temporarily loses consciousness. Syncope may also be called fainting or passing out. It is caused by a sudden decrease in blood flow to the brain. Even though most causes of syncope are not dangerous, syncope can be a sign of a serious medical problem. Your health care provider may do tests to find the reason why you are having syncope. Signs that you may be about to faint include:  Feeling dizzy or light-headed.  Feeling nauseous.  Seeing all white or all black in your field of vision.  Having cold, clammy skin. If you faint, get medical help right away. Call your local emergency services (911 in the [place] Do not drive yourself to the hospital. Follow these instructions at home: Pay attention to any changes in your symptoms. Take these actions to stay safe and to help relieve your symptoms: Lifestyle  Do not drive, use machinery, or play sports until your health care provider says it is okay.  Do not drink alcohol.  Do not use any products that contain nicotine or tobacco, such as cigarettes and e-cigarettes. If you need help quitting, ask your health care provider.  Drink enough fluid to keep your urine pale yellow. General instructions  Take over-the-counter and prescription medicines only as told by your health care provider.  If you are taking blood pressure or heart medicine, get up slowly and take several minutes to sit and then stand. This can reduce dizziness or light-headedness.  Have someone stay with you until you feel stable.  If you start to feel like you might faint, lie down right away and raise (elevate) your feet above the level of your heart. Breathe deeply and steadily. Wait until all the symptoms have passed.  Keep all follow-up visits as told by your health care provider. This is important. Get help right away if you:  Have a severe headache.  Faint once or repeatedly.  Have pain in your chest,  abdomen, or back.  Have a very fast or irregular heartbeat (palpitations).  Have pain when you breathe.  Are bleeding from your mouth or rectum, or you have black or tarry stool.  Have a seizure.  Are confused.  Have trouble walking.  Have severe weakness.  Have vision problems. These symptoms may represent a serious problem that is an emergency. Do not wait to see if your symptoms will go away. Get medical help right away. Call your local emergency services (911 in the [place] Do not drive yourself to the hospital. Summary  Syncope refers to a condition in which a person temporarily loses consciousness. It is caused by a sudden decrease in blood flow to the brain.  Signs that you may be about to faint include dizziness, feeling light-headed, feeling nauseous, sudden vision changes, or cold, clammy skin.  Although most causes of syncope are not dangerous, syncope can be a sign of a serious medical problem. If you faint, get medical help right away. =================================   Near-Syncope Near-syncope is when you suddenly feel like you might pass out (faint), but you do not actually lose consciousness. This may also be referred to as presyncope. During an episode of near-syncope, you may:  Feel dizzy, weak, or light-headed.  Feel nauseous.  See all white or all black in your field of vision, or see spots.  Have cold, clammy skin. This condition is caused by a sudden decrease in blood flow to the brain. This decrease can result from various  causes, but most of those causes are not dangerous. However, near-syncope may be a sign of a serious medical problem, so it is important to seek medical care. Follow these instructions at home: Medicines  Take over-the-counter and prescription medicines only as told by your health care provider.  If you are taking blood pressure or heart medicine, get up slowly and take several minutes to sit and then stand. This can reduce  dizziness. General instructions  Pay attention to any changes in your symptoms.  Talk with your health care provider about your symptoms. You may need to have testing to understand the cause of your near-syncope.  If you start to feel like you might faint, lie down right away and raise (elevate) your feet above the level of your heart. Breathe deeply and steadily. Wait until all of the symptoms have passed.  Have someone stay with you until you feel stable.  Do not drive, use machinery, or play sports until your health care provider says it is okay.  Drink enough fluid to keep your urine pale yellow.  Keep all follow-up visits as told by your health care provider. This is important. Get help right away if you:  Have a seizure.  Have unusual pain in your chest, abdomen, or back.  Faint once or repeatedly.  Have a severe headache.  Are bleeding from your mouth or rectum, or you have black or tarry stool.  Have a very fast or irregular heartbeat (palpitations).  Are confused.  Have trouble walking.  Have severe weakness.  Have vision problems. These symptoms may represent a serious problem that is an emergency. Do not wait to see if your symptoms will go away. Get medical help right away. Call your local emergency services (911 in the [place] Do not drive yourself to the hospital. Summary  Near-syncope is when you suddenly feel like you might pass out (faint), but you do not actually lose consciousness.  This condition is caused by a sudden decrease in blood flow to the brain. This decrease can result from various causes, but most of those causes are not dangerous.  Near-syncope may be a sign of a serious medical problem, so it is important to seek medical care. This information is not intended to replace advice given to you by your health care provider. Make sure you discuss any questions you have with your health care provider. Document Revised: 06/30/2018 Document  Reviewed: 01/25/2018 Elsevier Patient Education  [place].

## 2019-06-27 LAB — CBC WITH DIFFERENTIAL/PLATELET
Absolute Monocytes: 496 cells/uL (ref 200–950)
Basophils Absolute: 41 cells/uL (ref 0–200)
Basophils Relative: 0.6 %
Eosinophils Absolute: 88 cells/uL (ref 15–500)
Eosinophils Relative: 1.3 %
HCT: 40 % (ref 35.0–45.0)
Hemoglobin: 13.5 g/dL (ref 11.7–15.5)
Lymphs Abs: 1482 cells/uL (ref 850–3900)
MCH: 32.7 pg (ref 27.0–33.0)
MCHC: 33.8 g/dL (ref 32.0–36.0)
MCV: 96.9 fL (ref 80.0–100.0)
MPV: 12 fL (ref 7.5–12.5)
Monocytes Relative: 7.3 %
Neutro Abs: 4692 cells/uL (ref 1500–7800)
Neutrophils Relative %: 69 %
Platelets: 197 10*3/uL (ref 140–400)
RBC: 4.13 10*6/uL (ref 3.80–5.10)
RDW: 11.2 % (ref 11.0–15.0)
Total Lymphocyte: 21.8 %
WBC: 6.8 10*3/uL (ref 3.8–10.8)

## 2019-06-27 LAB — COMPLETE METABOLIC PANEL WITH GFR
AG Ratio: 1.8 (calc) (ref 1.0–2.5)
ALT: 9 U/L (ref 6–29)
AST: 12 U/L (ref 10–35)
Albumin: 4.3 g/dL (ref 3.6–5.1)
Alkaline phosphatase (APISO): 45 U/L (ref 31–125)
BUN: 14 mg/dL (ref 7–25)
CO2: 28 mmol/L (ref 20–32)
Calcium: 9.5 mg/dL (ref 8.6–10.2)
Chloride: 103 mmol/L (ref 98–110)
Creat: 0.85 mg/dL (ref 0.50–1.10)
GFR, Est African American: 96 mL/min/{1.73_m2} (ref >=60)
GFR, Est Non African American: 83 mL/min/{1.73_m2} (ref >=60)
Globulin: 2.4 g/dL (calc) (ref 1.9–3.7)
Glucose, Bld: 82 mg/dL (ref 65–99)
Potassium: 3.7 mmol/L (ref 3.5–5.3)
Sodium: 137 mmol/L (ref 135–146)
Total Bilirubin: 0.7 mg/dL (ref 0.2–1.2)
Total Protein: 6.7 g/dL (ref 6.1–8.1)

## 2019-06-27 LAB — TSH: TSH: 1.37 mIU/L

## 2019-08-27 LAB — HM MAMMOGRAPHY

## 2019-08-28 ENCOUNTER — Encounter: Payer: Self-pay | Admitting: *Deleted

## 2019-10-09 NOTE — Progress Notes (Signed)
   History of Present Illness:     Patient is a very nice 44 yo MWF who presents with c/o "Lt lower eye lid twitching. Patient relates onset to the 1st week of June episodes of twitching last up to 20-30 second and occurs intermittent throughout the day.  She saw her eye Dr who found no abnormalities with her vision & suggested that she see a Neurologist. She also reports occasional unrelated sensation of feeling light-headed. Denies postural dizziness or vertigo. Also, sh has long hx/o Left frontal HA's unrelated to her sx's of eyelid twitching. Denies any associated visual aura or sx's. Denies correlation with fatigue or reading or computer work.  Medications  .  fexofenadine 180 MG tablet, Take  daily. .  Fluticasone Propionate (FLONASE NA), Place into the nose daily. .  butalbital-acetaminophen-caffeine (FIORICET) 50-325-40 MG tablet, Take 1 to 2 tablets every 4 to 6 hours for Migraine .  VITAMIN D , Take 5,000 Units . Marland Kitchen  co-Q-10    30 MG capsule, Take 30 mg  daily. .  famotidine  20 MG tablet, Take as needed for heartburn .  MAGNESIUM , Take 1 tabletdaily. .  Multiple Vitamins-Minerals , Take  daily. .  Omega-3 FISH OIL , Take  daily. .  Tumeric 1 daily .  Hylands seasonal allergy relief(homeopathic) 1 daily .  Women's Vitamin 1 daily. .  Probiotic  Take 1 tablet  daily.  Problem list She has Vitamin D deficiency; GERD (gastroesophageal reflux disease); Body mass index (BMI) of 20.0-20.9 in adult; Medication management; and Baker's cyst of knee, right on their problem list.   Observations/Objective:   BP 106/70   Pulse 80   Temp 97.6 F (36.4 C)   Resp 16   Ht 5\' 6"  (1.676 m)   Wt 123 lb 9.6 oz (56.1 kg)   BMI 19.95 kg/m   HEENT - WNL. Neck - supple.  Chest - Clear equal BS. Cor - Nl HS. RRR w/o sig MGR. PP 1(+). No edema. MS- FROM w/o deformities.  Gait Nl. Neuro -  Nl w/o focal abnormalities.  No eyelid twitching reported or noted during visit  Assessment and  Plan:   1. Eyelid myoclonus  - Ambulatory referral to Neurology  Follow Up Instructions:      I discussed the assessment and treatment plan with the patient. The patient was provided an opportunity to ask questions and all were answered. The patient agreed with the plan and demonstrated an understanding of the plan.   Kirtland Bouchard, MD

## 2019-10-10 ENCOUNTER — Other Ambulatory Visit: Payer: Self-pay

## 2019-10-10 ENCOUNTER — Encounter: Payer: Self-pay | Admitting: Internal Medicine

## 2019-10-10 ENCOUNTER — Ambulatory Visit (INDEPENDENT_AMBULATORY_CARE_PROVIDER_SITE_OTHER): Payer: BC Managed Care – PPO | Admitting: Internal Medicine

## 2019-10-10 VITALS — BP 106/70 | HR 80 | Temp 97.6°F | Resp 16 | Ht 66.0 in | Wt 123.6 lb

## 2019-10-10 DIAGNOSIS — G253 Myoclonus: Secondary | ICD-10-CM | POA: Diagnosis not present

## 2019-12-19 ENCOUNTER — Ambulatory Visit: Payer: BC Managed Care – PPO | Admitting: Diagnostic Neuroimaging

## 2019-12-24 ENCOUNTER — Other Ambulatory Visit (HOSPITAL_COMMUNITY): Payer: Self-pay

## 2020-01-15 ENCOUNTER — Encounter: Payer: Self-pay | Admitting: Diagnostic Neuroimaging

## 2020-01-15 ENCOUNTER — Ambulatory Visit: Payer: BC Managed Care – PPO | Admitting: Diagnostic Neuroimaging

## 2020-01-15 ENCOUNTER — Other Ambulatory Visit: Payer: Self-pay

## 2020-01-15 VITALS — BP 109/68 | HR 71 | Ht 65.0 in | Wt 126.8 lb

## 2020-01-15 DIAGNOSIS — R253 Fasciculation: Secondary | ICD-10-CM

## 2020-01-15 DIAGNOSIS — R55 Syncope and collapse: Secondary | ICD-10-CM

## 2020-01-15 NOTE — Progress Notes (Signed)
[facility]  PATIENT: Danielle [name] DOB: November 16, 1975  REFERRING CLINICIAN: Unk Pinto, MD HISTORY FROM: patient  REASON FOR VISIT: new consult    HISTORICAL  CHIEF COMPLAINT:  Chief Complaint  Patient presents with  . Eyelid Myoclonus    rm 6 New Pt  "bottom lid of left eye was twitching x 2 months, has declined but now I'll feel twitches in random places in my body- is it anxiety or hormones?; I fainted 06/23/19-saw PCP, had labs but all were ok"     HISTORY OF PRESENT ILLNESS:   44 year old female here for evaluation of muscle twitching.  April 2021 patient was at home standing up in the kitchen after dinner, when all of a sudden she had vertigo and lightheadedness.  She tried to go sit down but ended up falling down and hitting her head on a column.  She may have briefly passed out for a second or 2.  This was witnessed by her family.  No convulsions, tongue biting or incontinence.  Her family helped her up to a sofa.  No postictal confusion.  Patient followed up with PCP and had some testing which were unremarkable.  No specific triggering factors were identified.  No change in diet, exercise, stress or sleep.  Following this patient was under heightened anxiety state because of this medical event.  She started to notice left eyelid twitching which lasted for several weeks.  Following this she has had intermittent muscle twitching in other parts of her body.  Her anxiety state has continued to stay high.  She has had some interrupted sleep.  Patient has some history of migraine headaches affecting her left and left head.  These happen once or twice a month.   REVIEW OF SYSTEMS: Full 14 system review of systems performed and negative with exception of: As per HPI.  ALLERGIES: Allergies  Allergen Reactions  . Sulfa Antibiotics   . Levaquin [Levofloxacin In D5w] Rash    HOME MEDICATIONS: Outpatient Medications Prior to Visit  Medication Sig Dispense  Refill  . butalbital-acetaminophen-caffeine (FIORICET) 50-325-40 MG tablet Take 1 to 2 tablets every 4 to 6 hours for Migraine 30 tablet 0  . Cholecalciferol (VITAMIN D PO) Take 5,000 Units by mouth.    . co-enzyme Q-10 30 MG capsule Take 30 mg by mouth daily.    . famotidine (PEPCID) 20 MG tablet Take 20 mg by mouth as needed for heartburn or indigestion. OTC    . fexofenadine (ALLEGRA ALLERGY) 180 MG tablet Take 180 mg by mouth daily.     . Fluticasone Propionate (FLONASE NA) Place into the nose daily.     Marland Kitchen MAGNESIUM PO Take 1 tablet by mouth daily.    . Multiple Vitamins-Minerals (MULTIVITAMIN ADULT PO) Take by mouth daily.    . Omega-3 Fatty Acids (FISH OIL PO) Take by mouth daily.    Marland Kitchen OVER THE COUNTER MEDICATION Tumeric 1 daily    . OVER THE COUNTER MEDICATION Women's Vitamin 1 daily.    . Probiotic Product (PROBIOTIC DAILY PO) Take 1 tablet by mouth daily.     Marland Kitchen OVER THE COUNTER MEDICATION Hylands seasonal allergy relief(homeopathic) 1 daily (Patient not taking: Reported on 10/10/2019)     No facility-administered medications prior to visit.    PAST MEDICAL HISTORY: Past Medical History:  Diagnosis Date  . GERD (gastroesophageal reflux disease)   . Hx of migraines    "always over left eye"  . Parvovirus infection 2013  . Shingles 2011  .  UTI (lower urinary tract infection)   . Vitamin D deficiency     PAST SURGICAL HISTORY: Past Surgical History:  Procedure Laterality Date  . ESOPHAGOGASTRODUODENOSCOPY ENDOSCOPY  2012    FAMILY HISTORY: Family History  Problem Relation Age of Onset  . Cancer Maternal Grandmother        breast  . Cancer Maternal Grandfather        lung  . Cancer Paternal Grandfather        prostate  . Mitral valve prolapse Mother   . Stomach cancer Paternal Grandmother     SOCIAL HISTORY: Social History   Socioeconomic History  . Marital status: Married    Spouse name: Gerald Stabs  . Number of children: 2  . Years of education: Not on file  .  Highest education level: Master's degree (e.g., MA, MS, MEng, MEd, MSW, MBA)  Occupational History    Comment: NA  Tobacco Use  . Smoking status: Never Smoker  . Smokeless tobacco: Never Used  Substance and Sexual Activity  . Alcohol use: No    Alcohol/week: 0.0 standard drinks    Comment: rarely  . Drug use: No  . Sexual activity: Not on file  Other Topics Concern  . Not on file  Social History Narrative   Lives with family   Caffeine- tea, 1 c, tea x 1 in day   Social Determinants of Health   Financial Resource Strain:   . Difficulty of Paying Living Expenses: Not on file  Food Insecurity:   . Worried About Charity fundraiser in the Last Year: Not on file  . Ran Out of Food in the Last Year: Not on file  Transportation Needs:   . Lack of Transportation (Medical): Not on file  . Lack of Transportation (Non-Medical): Not on file  Physical Activity:   . Days of Exercise per Week: Not on file  . Minutes of Exercise per Session: Not on file  Stress:   . Feeling of Stress : Not on file  Social Connections:   . Frequency of Communication with Friends and Family: Not on file  . Frequency of Social Gatherings with Friends and Family: Not on file  . Attends Religious Services: Not on file  . Active Member of Clubs or Organizations: Not on file  . Attends Archivist Meetings: Not on file  . Marital Status: Not on file  Intimate Partner Violence:   . Fear of Current or Ex-Partner: Not on file  . Emotionally Abused: Not on file  . Physically Abused: Not on file  . Sexually Abused: Not on file     PHYSICAL EXAM  GENERAL EXAM/CONSTITUTIONAL: Vitals:  Vitals:   01/15/20 1408  BP: 109/68  Pulse: 71  Weight: 126 lb 12.8 oz (57.5 kg)  Height: 5\' 5"  (1.651 m)     Body mass index is 21.1 kg/m. Wt Readings from Last 3 Encounters:  01/15/20 126 lb 12.8 oz (57.5 kg)  10/10/19 123 lb 9.6 oz (56.1 kg)  06/26/19 125 lb 6.4 oz (56.9 kg)     Patient is in no  distress; well developed, nourished and groomed; neck is supple  CARDIOVASCULAR:  Examination of carotid arteries is normal; no carotid bruits  Regular rate and rhythm, no murmurs  Examination of peripheral vascular system by observation and palpation is normal  EYES:  Ophthalmoscopic exam of optic discs and posterior segments is normal; no papilledema or hemorrhages  No exam data present  MUSCULOSKELETAL:  Gait, strength, tone,  movements noted in Neurologic exam below  NEUROLOGIC: MENTAL STATUS:  No flowsheet data found.  awake, alert, oriented to person, place and time  recent and remote memory intact  normal attention and concentration  language fluent, comprehension intact, naming intact  fund of knowledge appropriate  CRANIAL NERVE:   2nd - no papilledema on fundoscopic exam  2nd, 3rd, 4th, 6th - pupils equal and reactive to light, visual fields full to confrontation, extraocular muscles intact, no nystagmus  5th - facial sensation symmetric  7th - facial strength symmetric  8th - hearing intact  9th - palate elevates symmetrically, uvula midline  11th - shoulder shrug symmetric  12th - tongue protrusion midline  MOTOR:   normal bulk and tone, full strength in the BUE, BLE  SENSORY:   normal and symmetric to light touch, temperature, vibration  COORDINATION:   finger-nose-finger, fine finger movements normal  REFLEXES:   deep tendon reflexes present and symmetric  GAIT/STATION:   narrow based gait     DIAGNOSTIC DATA (LABS, IMAGING, TESTING) - I reviewed patient records, labs, notes, testing and imaging myself where available.  Lab Results  Component Value Date   WBC 6.8 06/26/2019   HGB 13.5 06/26/2019   HCT 40.0 06/26/2019   MCV 96.9 06/26/2019   PLT 197 06/26/2019      Component Value Date/Time   NA 137 06/26/2019 1426   K 3.7 06/26/2019 1426   CL 103 06/26/2019 1426   CO2 28 06/26/2019 1426   GLUCOSE 82 06/26/2019  1426   BUN 14 06/26/2019 1426   CREATININE 0.85 06/26/2019 1426   CALCIUM 9.5 06/26/2019 1426   PROT 6.7 06/26/2019 1426   ALBUMIN 4.1 03/09/2016 0931   AST 12 06/26/2019 1426   ALT 9 06/26/2019 1426   ALKPHOS 47 03/09/2016 0931   BILITOT 0.7 06/26/2019 1426   GFRNONAA 83 06/26/2019 1426   GFRAA 96 06/26/2019 1426   Lab Results  Component Value Date   CHOL 191 05/08/2019   HDL 74 05/08/2019   LDLCALC 98 05/08/2019   TRIG 93 05/08/2019   CHOLHDL 2.6 05/08/2019   Lab Results  Component Value Date   HGBA1C 5.0 05/08/2019   Lab Results  Component Value Date   ZOXWRUEA54 098 05/08/2019   Lab Results  Component Value Date   TSH 1.37 06/26/2019       ASSESSMENT AND PLAN  44 y.o. year old female here with intermittent muscle twitching, and syncope attack with vertigo in April 2021.  Neurologic examination unremarkable today.   Dx:  1. Syncope, unspecified syncope type   2. Muscle twitching     PLAN:  MUSCLE TWITCHING / SYNCOPE - check MRI brain w/wo (rule out stroke, demyelinating dz) - check cardiology consult (unprovoked syncope)  Orders Placed This Encounter  Procedures  . MR BRAIN W WO CONTRAST  . Ambulatory referral to Cardiology   Return for pending if symptoms worsen or fail to improve.    Penni Bombard, MD 11/91/4782, 9:56 PM Certified in Neurology, Neurophysiology and Neuroimaging  [facility] [address], [place], [place] [address] [phone]

## 2020-01-15 NOTE — Patient Instructions (Addendum)
  MUSCLE TWITCHING / NEAR SYNCOPE - check MRI brain w/wo - consider cardiology consult

## 2020-01-16 ENCOUNTER — Telehealth: Payer: Self-pay | Admitting: Diagnostic Neuroimaging

## 2020-01-16 NOTE — Telephone Encounter (Signed)
LVM for pt to call back about scheduling mri  [employer] Auth: [id number] (exp. 01/16/20 to 07/13/20)

## 2020-01-16 NOTE — Telephone Encounter (Signed)
Patient returned my call she is scheduled at [facility] for 01/23/20.

## 2020-01-23 ENCOUNTER — Ambulatory Visit: Payer: BC Managed Care – PPO

## 2020-01-23 DIAGNOSIS — R55 Syncope and collapse: Secondary | ICD-10-CM | POA: Diagnosis not present

## 2020-01-23 DIAGNOSIS — R253 Fasciculation: Secondary | ICD-10-CM

## 2020-01-23 MED ORDER — GADOBENATE DIMEGLUMINE 529 MG/ML IV SOLN
10.0000 mL | Freq: Once | INTRAVENOUS | Status: AC | PRN
  Administered 2020-01-23: 10 mL via INTRAVENOUS

## 2020-01-24 ENCOUNTER — Encounter: Payer: Self-pay | Admitting: *Deleted

## 2020-02-07 ENCOUNTER — Other Ambulatory Visit: Payer: Self-pay

## 2020-02-07 ENCOUNTER — Encounter: Payer: Self-pay | Admitting: Internal Medicine

## 2020-02-07 ENCOUNTER — Ambulatory Visit: Payer: BC Managed Care – PPO | Admitting: Internal Medicine

## 2020-02-07 VITALS — BP 104/70 | HR 69 | Ht 65.0 in | Wt 125.0 lb

## 2020-02-07 DIAGNOSIS — R55 Syncope and collapse: Secondary | ICD-10-CM

## 2020-02-07 NOTE — Patient Instructions (Signed)
Medication Instructions:  Your physician recommends that you continue on your current medications as directed. Please refer to the Current Medication list given to you today.  *If you need a refill on your cardiac medications before your next appointment, please call your pharmacy*  Lab Work: None ordered today  If you have labs (blood work) drawn today and your tests are completely normal, you will receive your results only by: Marland Kitchen MyChart Message (if you have MyChart) OR . A paper copy in the mail If you have any lab test that is abnormal or we need to change your treatment, we will call you to review the results.  Testing/Procedures: Your physician has requested that you have an echocardiogram. Echocardiography is a painless test that uses sound waves to create images of your heart. It provides your doctor with information about the size and shape of your heart and how well your heart's chambers and valves are working. This procedure takes approximately one hour. There are no restrictions for this procedure.  Follow-Up: At [facility], you and your health needs are our priority.  As part of our continuing mission to provide you with exceptional heart care, we have created designated Provider Care Teams.  These Care Teams include your primary Cardiologist (physician) and Advanced Practice Providers (APPs -  Physician Assistants and Nurse Practitioners) who all work together to provide you with the care you need, when you need it.  Your next appointment:   As needed  The format for your next appointment:   In Person  Provider:   Rudean Haskell, MD

## 2020-02-07 NOTE — Progress Notes (Signed)
Cardiology Office Note:    Date:  02/07/2020   ID:  Danielle [name], DOB 01-24-1976, MRN [id number]  PCP:  Danielle Pinto, MD  [facility] Cardiologist:  No primary care provider on file.  [facility] Electrophysiologist:  None   CC: Syncope Consulted for the evaluation of syncope at the behest of Danielle Pinto, MD  History of Present Illness:    Danielle [name] is a 44 y.o. female with a hx of prior syncopal event.  Patient had vertigoness sx after dinner 06/2019, fell and hit her head.  Normal blood pressures both during that time and since. No further syncope Patient had an ecg that looked ok.  Patient notes that she spends most of her time time with her kids.  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  No syncope without issues.  No chest pain, chest pressure, PND, orthopnea.  Palpitations- patient can hear her heart rate and that it beats funny.  Has felt it a couple of times in the last two weeks.  Seen by neuro without other issues or diagnoses.  No CP, Chest pressure.  No further dizziness.  Past Medical History:  Diagnosis Date  . GERD (gastroesophageal reflux disease)   . Hx of migraines    "always over left eye"  . Parvovirus infection 2013  . Shingles 2011  . UTI (lower urinary tract infection)   . Vitamin D deficiency     Past Surgical History:  Procedure Laterality Date  . ESOPHAGOGASTRODUODENOSCOPY ENDOSCOPY  2012    Current Medications: Current Meds  Medication Sig  . butalbital-acetaminophen-caffeine (FIORICET) 50-325-40 MG tablet Take 1 to 2 tablets every 4 to 6 hours for Migraine  . Cholecalciferol (VITAMIN D PO) Take 5,000 Units by mouth.  . co-enzyme Q-10 30 MG capsule Take 30 mg by mouth daily.  . famotidine (PEPCID) 20 MG tablet Take 20 mg by mouth as needed for heartburn or indigestion. OTC  . fexofenadine (ALLEGRA ALLERGY) 180 MG tablet Take 180 mg by mouth daily.   . Fluticasone Propionate (FLONASE NA) Place into the nose daily.   Marland Kitchen MAGNESIUM PO  Take 1 tablet by mouth daily.  . Multiple Vitamins-Minerals (MULTIVITAMIN ADULT PO) Take by mouth daily.  . Omega-3 Fatty Acids (FISH OIL PO) Take by mouth daily.  Marland Kitchen OVER THE COUNTER MEDICATION Tumeric 1 daily  . OVER THE COUNTER MEDICATION Women's Vitamin 1 daily.  . Probiotic Product (PROBIOTIC DAILY PO) Take 1 tablet by mouth daily.      Allergies:   Sulfa antibiotics and Levaquin [levofloxacin in d5w]   Social History   Socioeconomic History  . Marital status: Married    Spouse name: Danielle [name]  . Number of children: 2  . Years of education: Not on file  . Highest education level: Master's degree (e.g., MA, MS, MEng, MEd, MSW, MBA)  Occupational History    Comment: NA  Tobacco Use  . Smoking status: Never Smoker  . Smokeless tobacco: Never Used  Substance and Sexual Activity  . Alcohol use: No    Alcohol/week: 0.0 standard drinks    Comment: rarely  . Drug use: No  . Sexual activity: Not on file  Other Topics Concern  . Not on file  Social History Narrative   Lives with family   Caffeine- tea, 1 c, tea x 1 in day   Social Determinants of Health   Financial Resource Strain:   . Difficulty of Paying Living Expenses: Not on file  Food Insecurity:   .  Worried About Charity fundraiser in the Last Year: Not on file  . Ran Out of Food in the Last Year: Not on file  Transportation Needs:   . Lack of Transportation (Medical): Not on file  . Lack of Transportation (Non-Medical): Not on file  Physical Activity:   . Days of Exercise per Week: Not on file  . Minutes of Exercise per Session: Not on file  Stress:   . Feeling of Stress : Not on file  Social Connections:   . Frequency of Communication with Friends and Family: Not on file  . Frequency of Social Gatherings with Friends and Family: Not on file  . Attends Religious Services: Not on file  . Active Member of Clubs or Organizations: Not on file  . Attends Archivist Meetings: Not on file  . Marital Status:  Not on file    Family History: The patient's family history includes Cancer in her maternal grandfather, maternal grandmother, and paternal grandfather; Mitral valve prolapse in her mother; Stomach cancer in her paternal grandmother. Mother have Mitral valve repair at age 10.  No syncope or near syncope.  No SCD, no drownings, no car accidents.  ROS:   Please see the history of present illness.     All other systems reviewed and are negative.  EKGs/Labs/Other Studies Reviewed:    The following studies were reviewed today:  EKG:  EKG is  ordered today.  The ekg ordered today demonstrates Sinus 60 no ST/T changes.  Recent Labs: 05/08/2019: Magnesium 2.0 06/26/2019: ALT 9; BUN 14; Creat 0.85; Hemoglobin 13.5; Platelets 197; Potassium 3.7; Sodium 137; TSH 1.37  Recent Lipid Panel    Component Value Date/Time   CHOL 191 05/08/2019 0952   TRIG 93 05/08/2019 0952   HDL 74 05/08/2019 0952   CHOLHDL 2.6 05/08/2019 0952   VLDL 10 03/09/2016 0931   LDLCALC 98 05/08/2019 0952    Physical Exam:    VS:  BP 104/70   Pulse 69   Ht 5\' 5"  (1.651 m)   Wt 125 lb (56.7 kg)   SpO2 95%   BMI 20.80 kg/m     Wt Readings from Last 3 Encounters:  02/07/20 125 lb (56.7 kg)  01/15/20 126 lb 12.8 oz (57.5 kg)  10/10/19 123 lb 9.6 oz (56.1 kg)   GEN:  Well nourished, well developed in no acute distress HEENT: Normal NECK: No JVD; No carotid bruits LYMPHATICS: No lymphadenopathy CARDIAC: RRR, no murmurs, rubs, gallops RESPIRATORY:  Clear to auscultation without rales, wheezing or rhonchi  ABDOMEN: Soft, non-tender, non-distended MUSCULOSKELETAL:  No edema; No deformity  SKIN: Warm and dry NEUROLOGIC:  Alert and oriented x 3 PSYCHIATRIC:  Normal affect   ASSESSMENT:    1. Syncope and collapse    PLAN:    Syncope and collapse History of MVP in family Palpitations - will echocardiogram - shared decision making: will defer Ziopatch at this time unless palpitations are mor frequent  PRN  follow up unless new symptoms or abnormal test results warranting change in plan   Shared Decision Making/Informed Consent        Medication Adjustments/Labs and Tests Ordered: Current medicines are reviewed at length with the patient today.  Concerns regarding medicines are outlined above.  Orders Placed This Encounter  Procedures  . ECHOCARDIOGRAM COMPLETE   No orders of the defined types were placed in this encounter.   Patient Instructions  Medication Instructions:  Your physician recommends that you continue on your current  medications as directed. Please refer to the Current Medication list given to you today.  *If you need a refill on your cardiac medications before your next appointment, please call your pharmacy*  Lab Work: None ordered today  If you have labs (blood work) drawn today and your tests are completely normal, you will receive your results only by: Marland Kitchen MyChart Message (if you have MyChart) OR . A paper copy in the mail If you have any lab test that is abnormal or we need to change your treatment, we will call you to review the results.  Testing/Procedures: Your physician has requested that you have an echocardiogram. Echocardiography is a painless test that uses sound waves to create images of your heart. It provides your doctor with information about the size and shape of your heart and how well your heart's chambers and valves are working. This procedure takes approximately one hour. There are no restrictions for this procedure.  Follow-Up: At [facility], you and your health needs are our priority.  As part of our continuing mission to provide you with exceptional heart care, we have created designated Provider Care Teams.  These Care Teams include your primary Cardiologist (physician) and Advanced Practice Providers (APPs -  Physician Assistants and Nurse Practitioners) who all work together to provide you with the care you need, when you need it.  Your  next appointment:   As needed  The format for your next appointment:   In Person  Provider:   Rudean Haskell, MD     Signed, Werner Lean, MD  02/07/2020 12:38 PM    [place]

## 2020-03-06 ENCOUNTER — Ambulatory Visit (HOSPITAL_COMMUNITY): Payer: BC Managed Care – PPO | Attending: Cardiology

## 2020-03-06 ENCOUNTER — Other Ambulatory Visit: Payer: Self-pay

## 2020-03-06 DIAGNOSIS — R55 Syncope and collapse: Secondary | ICD-10-CM | POA: Diagnosis not present

## 2020-03-06 LAB — ECHOCARDIOGRAM COMPLETE
Area-P 1/2: 3.17 cm2
S' Lateral: 2.7 cm

## 2020-05-19 ENCOUNTER — Encounter: Payer: Self-pay | Admitting: Internal Medicine

## 2020-05-19 NOTE — Patient Instructions (Signed)

## 2020-05-19 NOTE — Progress Notes (Signed)
Annual Screening/Preventative Visit & Comprehensive Evaluation &  Examination      This very nice 45 y.o.  MWF presents for a Screening /Preventative Visit & comprehensive evaluation and management of multiple medical co-morbidities.  Patient has been followed for labile HTN, HLD, abnormal glucose and Vitamin D Deficiency. Patient has GERD controlled with OTC famotidine.       Patient is followed expectantly for labile  HTN. Patient's BP has been controlled at home and patient denies any cardiac symptoms as chest pain, palpitations, shortness of breath, dizziness or ankle swelling. Today's BP is at goal - 120/80 . Last April  Patient had an episode of syncope and had Cardiology consultation & a normal 2D cardiac echo and no further w/u was recommended. She also had Neurology concultation with c/o "twitching" of her eyelids and had a Nl Neuro exam & negative Brain MRI .        Patient's hyperlipidemia is controlled with diet. Last lipids were at goal:  Lab Results  Component Value Date   CHOL 191 05/08/2019   HDL 74 05/08/2019   LDLCALC 98 05/08/2019   TRIG 93 05/08/2019   CHOLHDL 2.6 05/08/2019        Patient is monitored for glucose intolerance.   Patient denies reactive hypoglycemic symptoms, visual blurring, diabetic polys or paresthesias. Last A1c was normal & at goal:  Lab Results  Component Value Date   HGBA1C 5.0 05/08/2019        Finally, patient has history of Vitamin D Deficiency ("42" on tx /2016) and last Vitamin D was at goal:  Lab Results  Component Value Date   VD25OH 65 05/08/2019    Current Outpatient Medications on File Prior to Visit  Medication Sig  . FIORICET Take 1 to 2 tablets every 4 to 6 hours for Migraine  . VITAMIN D  Take 5,000 Units   . co-enzyme Q-10 30 MG  Take 30 mg \\daily .  . famotidine 20 MG tablet OTC Take  as needed for heartburn   . fexofenadine 180 MG tablet Take  daily.   Marland Kitchen FLONASE Place into the nose daily.   Marland Kitchen MAGNESIUM  Take 1  tablet  daily.  . Multi-Vitamins-Minerals  Take  daily.  . Omega-3  FISH OIL  Take  daily.  . Tumeric  1 daily  . Women's Vitamin  1 daily.  . Probiotic  Take 1 tablet daily.     Allergies  Allergen Reactions  . Sulfa Antibiotics   . Levaquin [Levofloxacin In D5w] Rash    Past Medical History:  Diagnosis Date  . GERD (gastroesophageal reflux disease)   . Hx of migraines    "always over left eye"  . Parvovirus infection 2013  . Shingles 2011  . UTI (lower urinary tract infection)   . Vitamin D deficiency     Health Maintenance  Topic Date Due  . INFLUENZA VACCINE  10/21/2019  . TETANUS/TDAP  06/22/2026  . Hepatitis C Screening  Completed  . HIV Screening  Completed  . HPV VACCINES  Aged Out    Immunization History  Administered Date(s) Administered  . Influenza Whole 12/30/2011  . Influenza, Seasonal, Injecte, Preservative Fre 02/05/2015  . Influenza-Unspecified 01/21/2014  . PPD Test 01/23/2013, 01/23/2014, 02/05/2015, 03/09/2016, 04/13/2017, 04/28/2018, 05/08/2019  . Pneumococcal Conjugate-13 12/30/2011  . Td 01/29/2006  . Tdap 06/21/2016    Last MGM - 08/27/2019 - Dr Ronita Hipps  Past Surgical History:  Procedure Laterality Date  . ESOPHAGOGASTRODUODENOSCOPY ENDOSCOPY  2012  Family History  Problem Relation Age of Onset  . Cancer Maternal Grandmother        breast  . Cancer Maternal Grandfather        lung  . Cancer Paternal Grandfather        prostate  . Mitral valve prolapse Mother   . Stomach cancer Paternal Grandmother     Social History   Tobacco Use  . Smoking status: Never Smoker  . Smokeless tobacco: Never Used  Substance Use Topics  . Alcohol use: No    Alcohol/week: 0.0 standard drinks    Comment: rarely  . Drug use: No    ROS Constitutional: Denies fever, chills, weight loss/gain, headaches, insomnia,  night sweats, and change in appetite. Does c/o fatigue. Eyes: Denies redness, blurred vision, diplopia, discharge, itchy, watery  eyes.  ENT: Denies discharge, congestion, post nasal drip, epistaxis, sore throat, earache, hearing loss, dental pain, Tinnitus, Vertigo, Sinus pain, snoring.  Cardio: Denies chest pain, palpitations, irregular heartbeat, syncope, dyspnea, diaphoresis, orthopnea, PND, claudication, edema Respiratory: denies cough, dyspnea, DOE, pleurisy, hoarseness, laryngitis, wheezing.  Gastrointestinal: Denies dysphagia, heartburn, reflux, water brash, pain, cramps, nausea, vomiting, bloating, diarrhea, constipation, hematemesis, melena, hematochezia, jaundice, hemorrhoids Genitourinary: Denies dysuria, frequency, urgency, nocturia, hesitancy, discharge, hematuria, flank pain Breast: Breast lumps, nipple discharge, bleeding.  Musculoskeletal: Denies arthralgia, myalgia, stiffness, Jt. Swelling, pain, limp, and strain/sprain. Denies falls. Skin: Denies puritis, rash, hives, warts, acne, eczema, changing in skin lesion Neuro: No weakness, tremor, incoordination, spasms, paresthesia, pain Psychiatric: Denies confusion, memory loss, sensory loss. Denies Depression. Endocrine: Denies change in weight, skin, hair change, nocturia, and paresthesia, diabetic polys, visual blurring, hyper / hypo glycemic episodes.  Heme/Lymph: No excessive bleeding, bruising, enlarged lymph nodes.  Physical Exam  BP 120/80   Pulse 76   Temp (!) 97.2 F (36.2 C)   Resp 16   Ht 5\' 6"  (1.676 m)   Wt 129 lb 9.6 oz (58.8 kg)   SpO2 99%   BMI 20.92 kg/m   General Appearance: Well nourished, well groomed and in no apparent distress.  Eyes: PERRLA, EOMs, conjunctiva no swelling or erythema, normal fundi and vessels. Sinuses: No frontal/maxillary tenderness ENT/Mouth: EACs patent / TMs  nl. Nares clear without erythema, swelling, mucoid exudates. Oral hygiene is good. No erythema, swelling, or exudate. Tongue normal, non-obstructing. Tonsils not swollen or erythematous. Hearing normal.  Neck: Supple, thyroid not palpable. No bruits,  nodes or JVD. Respiratory: Respiratory effort normal.  BS equal and clear bilateral without rales, rhonci, wheezing or stridor. Cardio: Heart sounds are normal with regular rate and rhythm and no murmurs, rubs or gallops. Peripheral pulses are normal and equal bilaterally without edema. No aortic or femoral bruits. Chest: symmetric with normal excursions and percussion. Breasts: Symmetric, without lumps, nipple discharge, retractions, or fibrocystic changes.  Abdomen: Flat, soft with bowel sounds active. Nontender, no guarding, rebound, hernias, masses, or organomegaly.  Lymphatics: Non tender without lymphadenopathy.  Genitourinary:  Musculoskeletal: Full ROM all peripheral extremities, joint stability, 5/5 strength, and normal gait. Skin: Warm and dry without rashes, lesions, cyanosis, clubbing or  ecchymosis.  Neuro: Cranial nerves intact, reflexes equal bilaterally. Normal muscle tone, no cerebellar symptoms. Sensation intact.  Pysch: Alert and oriented X 3, normal affect, Insight and Judgment appropriate.   Assessment and Plan  1. Annual Preventative Screening Examination   2. Elevated BP without diagnosis of hypertension  - EKG 12-Lead - Iron,Total/Total Iron Binding Cap - Vitamin B12 - CBC with Differential/Platelet - COMPLETE METABOLIC PANEL WITH  GFR - Magnesium - TSH  3. Hyperlipidemia, mixed  - EKG 12-Lead - Lipid panel - TSH  4. Abnormal glucose  - EKG 12-Lead - Hemoglobin A1c - Insulin, random  5. Vitamin D deficiency  - VITAMIN D 25 Hydroxy  6. Migraine   - Magnesium  7. Screening for colorectal cancer  - POC Hemoccult Bld/Stl   8. Screening examination for pulmonary tuberculosis  - TB Skin Test  9. Screening for ischemic heart disease  - EKG 12-Lead  10. FHx: heart disease  - EKG 12-Lead  11. Fatigue  - Iron,Total/Total Iron Binding Cap - Vitamin B12 - CBC with Differential/Platelet - COMPLETE METABOLIC PANEL WITH GFR - TSH  12.  Medication management  - Urinalysis, Routine w reflex microscopic - Microalbumin / creatinine urine ratio - CBC with Differential/Platelet - COMPLETE METABOLIC PANEL WITH GFR - Magnesium - Lipid panel - TSH - Hemoglobin A1c - Insulin, random - VITAMIN D 25 Hydroxyl          Patient was counseled in prudent diet to achieve/maintain BMI less than 25 for weight control, BP monitoring, regular exercise and medications. Discussed med's effects and SE's. Screening labs and tests as requested with regular follow-up as recommended. Over 40 minutes of exam, counseling, chart review and high complex critical decision making was performed.   Kirtland Bouchard, MD

## 2020-05-20 ENCOUNTER — Ambulatory Visit (INDEPENDENT_AMBULATORY_CARE_PROVIDER_SITE_OTHER): Payer: BC Managed Care – PPO | Admitting: Internal Medicine

## 2020-05-20 ENCOUNTER — Other Ambulatory Visit: Payer: Self-pay

## 2020-05-20 ENCOUNTER — Encounter: Payer: Self-pay | Admitting: Internal Medicine

## 2020-05-20 VITALS — BP 120/80 | HR 76 | Temp 97.2°F | Resp 16 | Ht 66.0 in | Wt 129.6 lb

## 2020-05-20 DIAGNOSIS — Z0000 Encounter for general adult medical examination without abnormal findings: Secondary | ICD-10-CM

## 2020-05-20 DIAGNOSIS — Z1329 Encounter for screening for other suspected endocrine disorder: Secondary | ICD-10-CM

## 2020-05-20 DIAGNOSIS — Z1212 Encounter for screening for malignant neoplasm of rectum: Secondary | ICD-10-CM

## 2020-05-20 DIAGNOSIS — R7309 Other abnormal glucose: Secondary | ICD-10-CM

## 2020-05-20 DIAGNOSIS — E782 Mixed hyperlipidemia: Secondary | ICD-10-CM

## 2020-05-20 DIAGNOSIS — Z136 Encounter for screening for cardiovascular disorders: Secondary | ICD-10-CM | POA: Diagnosis not present

## 2020-05-20 DIAGNOSIS — Z79899 Other long term (current) drug therapy: Secondary | ICD-10-CM

## 2020-05-20 DIAGNOSIS — Z1211 Encounter for screening for malignant neoplasm of colon: Secondary | ICD-10-CM

## 2020-05-20 DIAGNOSIS — Z8249 Family history of ischemic heart disease and other diseases of the circulatory system: Secondary | ICD-10-CM | POA: Diagnosis not present

## 2020-05-20 DIAGNOSIS — Z13220 Encounter for screening for lipoid disorders: Secondary | ICD-10-CM | POA: Diagnosis not present

## 2020-05-20 DIAGNOSIS — Z111 Encounter for screening for respiratory tuberculosis: Secondary | ICD-10-CM | POA: Diagnosis not present

## 2020-05-20 DIAGNOSIS — R5383 Other fatigue: Secondary | ICD-10-CM

## 2020-05-20 DIAGNOSIS — Z131 Encounter for screening for diabetes mellitus: Secondary | ICD-10-CM

## 2020-05-20 DIAGNOSIS — E559 Vitamin D deficiency, unspecified: Secondary | ICD-10-CM | POA: Diagnosis not present

## 2020-05-20 DIAGNOSIS — Z0001 Encounter for general adult medical examination with abnormal findings: Secondary | ICD-10-CM

## 2020-05-20 DIAGNOSIS — Z1389 Encounter for screening for other disorder: Secondary | ICD-10-CM

## 2020-05-20 DIAGNOSIS — Z130 Encounter for screening for diseases of the blood and blood-forming organs and certain disorders involving the immune mechanism: Secondary | ICD-10-CM

## 2020-05-20 DIAGNOSIS — R030 Elevated blood-pressure reading, without diagnosis of hypertension: Secondary | ICD-10-CM | POA: Diagnosis not present

## 2020-05-20 DIAGNOSIS — G43009 Migraine without aura, not intractable, without status migrainosus: Secondary | ICD-10-CM

## 2020-05-21 LAB — CBC WITH DIFFERENTIAL/PLATELET
Absolute Monocytes: 456 cells/uL (ref 200–950)
Basophils Absolute: 47 cells/uL (ref 0–200)
Basophils Relative: 0.7 %
Eosinophils Absolute: 121 cells/uL (ref 15–500)
Eosinophils Relative: 1.8 %
HCT: 41.8 % (ref 35.0–45.0)
Hemoglobin: 14 g/dL (ref 11.7–15.5)
Lymphs Abs: 1293 cells/uL (ref 850–3900)
MCH: 32.9 pg (ref 27.0–33.0)
MCHC: 33.5 g/dL (ref 32.0–36.0)
MCV: 98.1 fL (ref 80.0–100.0)
MPV: 11.9 fL (ref 7.5–12.5)
Monocytes Relative: 6.8 %
Neutro Abs: 4784 cells/uL (ref 1500–7800)
Neutrophils Relative %: 71.4 %
Platelets: 226 10*3/uL (ref 140–400)
RBC: 4.26 10*6/uL (ref 3.80–5.10)
RDW: 10.8 % — ABNORMAL LOW (ref 11.0–15.0)
Total Lymphocyte: 19.3 %
WBC: 6.7 10*3/uL (ref 3.8–10.8)

## 2020-05-21 LAB — COMPLETE METABOLIC PANEL WITH GFR
AG Ratio: 1.8 (calc) (ref 1.0–2.5)
ALT: 12 U/L (ref 6–29)
AST: 16 U/L (ref 10–35)
Albumin: 4.4 g/dL (ref 3.6–5.1)
Alkaline phosphatase (APISO): 44 U/L (ref 31–125)
BUN: 16 mg/dL (ref 7–25)
CO2: 28 mmol/L (ref 20–32)
Calcium: 9.8 mg/dL (ref 8.6–10.2)
Chloride: 103 mmol/L (ref 98–110)
Creat: 0.91 mg/dL (ref 0.50–1.10)
GFR, Est African American: 88 mL/min/{1.73_m2} (ref >=60)
GFR, Est Non African American: 76 mL/min/{1.73_m2} (ref >=60)
Globulin: 2.4 g/dL (calc) (ref 1.9–3.7)
Glucose, Bld: 85 mg/dL (ref 65–99)
Potassium: 4.2 mmol/L (ref 3.5–5.3)
Sodium: 137 mmol/L (ref 135–146)
Total Bilirubin: 0.9 mg/dL (ref 0.2–1.2)
Total Protein: 6.8 g/dL (ref 6.1–8.1)

## 2020-05-21 LAB — URINALYSIS, ROUTINE W REFLEX MICROSCOPIC
Bacteria, UA: NONE SEEN /HPF
Bilirubin Urine: NEGATIVE
Glucose, UA: NEGATIVE
Hyaline Cast: NONE SEEN /LPF
Ketones, ur: NEGATIVE
Leukocytes,Ua: NEGATIVE
Nitrite: NEGATIVE
Protein, ur: NEGATIVE
Specific Gravity, Urine: 1.006 (ref 1.001–1.03)
Squamous Epithelial / HPF: NONE SEEN /HPF (ref <=5)
WBC, UA: NONE SEEN /HPF (ref 0–5)
pH: 7.5 (ref 5.0–8.0)

## 2020-05-21 LAB — HEMOGLOBIN A1C
Hgb A1c MFr Bld: 4.9 % of total Hgb (ref <5.7)
Mean Plasma Glucose: 94 mg/dL
eAG (mmol/L): 5.2 mmol/L

## 2020-05-21 LAB — IRON, TOTAL/TOTAL IRON BINDING CAP
%SAT: 31 % (calc) (ref 16–45)
Iron: 114 ug/dL (ref 40–190)
TIBC: 372 mcg/dL (calc) (ref 250–450)

## 2020-05-21 LAB — TSH: TSH: 1.35 mIU/L

## 2020-05-21 LAB — MAGNESIUM: Magnesium: 2 mg/dL (ref 1.5–2.5)

## 2020-05-21 LAB — INSULIN, RANDOM: Insulin: 5.1 u[IU]/mL

## 2020-05-21 LAB — MICROALBUMIN / CREATININE URINE RATIO
Creatinine, Urine: 38 mg/dL (ref 20–275)
Microalb, Ur: <0.2 mg/dL

## 2020-05-21 LAB — VITAMIN D 25 HYDROXY (VIT D DEFICIENCY, FRACTURES): Vit D, 25-Hydroxy: 89 ng/mL (ref 30–100)

## 2020-05-21 LAB — LIPID PANEL
Cholesterol: 190 mg/dL (ref <200)
HDL: 78 mg/dL (ref >=50)
LDL Cholesterol (Calc): 97 mg/dL (calc)
Non-HDL Cholesterol (Calc): 112 mg/dL (calc) (ref <130)
Total CHOL/HDL Ratio: 2.4 (calc) (ref <5.0)
Triglycerides: 67 mg/dL (ref <150)

## 2020-05-21 LAB — VITAMIN B12: Vitamin B-12: 1189 pg/mL — ABNORMAL HIGH (ref 200–1100)

## 2020-05-21 NOTE — Progress Notes (Signed)
============================================================ ============================================================  -    Vitamin D = 89 -  (  finally  returned ) and       is Excellent  !  ============================================================ ============================================================

## 2020-05-21 NOTE — Progress Notes (Signed)
============================================================ -   Test results slightly outside the reference range are not unusual. If there is anything important, I will review this with you,  otherwise it is considered normal test values.  If you have further questions,  please do not hesitate to contact me at the office or via My Chart.  ============================================================ ============================================================  -  Iron & Vitamin B12 levels - Both Normal & OK  ============================================================ ============================================================  -  Total Chol = 190  and LDL Chol = 97 - Both  Excellent   - Very low risk for Heart Attack  / Stroke ========================================================  - A1c - Normal - Great - No Diabetes ! ============================================================ ============================================================  -  All Else - CBC - Kidneys - Electrolytes - Liver - Magnesium & Thyroid    - all  Normal / OK ============================================================ ============================================================

## 2020-05-27 LAB — TB SKIN TEST
Induration: 0 mm
TB Skin Test: NEGATIVE

## 2020-06-07 ENCOUNTER — Other Ambulatory Visit: Payer: Self-pay | Admitting: Internal Medicine

## 2020-06-07 DIAGNOSIS — Z1211 Encounter for screening for malignant neoplasm of colon: Secondary | ICD-10-CM

## 2020-06-26 ENCOUNTER — Encounter: Payer: Self-pay | Admitting: Internal Medicine

## 2020-09-05 ENCOUNTER — Other Ambulatory Visit: Payer: Self-pay

## 2020-09-05 ENCOUNTER — Ambulatory Visit (AMBULATORY_SURGERY_CENTER): Payer: BC Managed Care – PPO | Admitting: *Deleted

## 2020-09-05 VITALS — Ht 66.0 in | Wt 129.0 lb

## 2020-09-05 DIAGNOSIS — Z1211 Encounter for screening for malignant neoplasm of colon: Secondary | ICD-10-CM

## 2020-09-05 NOTE — Progress Notes (Signed)
Patient's pre-visit was done today over the phone with the patient due to COVID-19 pandemic. Name,DOB and address verified. Insurance verified. Patient denies any allergies to Eggs and Soy. Patient denies any problems with anesthesia/sedation. Patient denies taking diet pills or blood thinners. Packet of Prep instructions mailed to patient including a copy of a consent form-pt is aware. Patient understands to call us back with any questions or concerns. Patient is aware of our care-partner policy and [id number] safety protocol. EMMI education assigned to the patient for the procedure, sent to [facility].

## 2020-09-16 ENCOUNTER — Encounter: Payer: Self-pay | Admitting: Internal Medicine

## 2020-09-19 ENCOUNTER — Encounter: Payer: Self-pay | Admitting: Internal Medicine

## 2020-09-19 ENCOUNTER — Other Ambulatory Visit: Payer: Self-pay

## 2020-09-19 ENCOUNTER — Ambulatory Visit (AMBULATORY_SURGERY_CENTER): Payer: BC Managed Care – PPO | Admitting: Internal Medicine

## 2020-09-19 VITALS — BP 101/65 | HR 71 | Temp 98.1°F | Resp 17 | Ht 66.0 in | Wt 129.0 lb

## 2020-09-19 DIAGNOSIS — D120 Benign neoplasm of cecum: Secondary | ICD-10-CM

## 2020-09-19 DIAGNOSIS — D122 Benign neoplasm of ascending colon: Secondary | ICD-10-CM

## 2020-09-19 DIAGNOSIS — D121 Benign neoplasm of appendix: Secondary | ICD-10-CM

## 2020-09-19 DIAGNOSIS — Z1211 Encounter for screening for malignant neoplasm of colon: Secondary | ICD-10-CM

## 2020-09-19 MED ORDER — SODIUM CHLORIDE 0.9 % IV SOLN: 500.0000 mL | Freq: Once | INTRAVENOUS | Status: DC

## 2020-09-19 NOTE — Progress Notes (Signed)
pt tolerated well. VSS. awake and to recovery. Report given to RN.  

## 2020-09-19 NOTE — Patient Instructions (Addendum)
I found and removed 5 tiny polyps. All look benign though I suspect they are pre-cancerous. Please read the handout provided.   I will let you know pathology results and when to have another routine colonoscopy by mail and/or My Chart.   You also have a condition called diverticulosis - common and not usually a problem. Please read the handout provided.  I appreciate the opportunity to care for you. Gatha Mayer, MD, [facility]  Continue your normal medications  YOU HAD AN ENDOSCOPIC PROCEDURE TODAY AT [facility]:   Refer to the procedure report that was given to you for any specific questions about what was found during the examination.  If the procedure report does not answer your questions, please call your gastroenterologist to clarify.  If you requested that your care partner not be given the details of your procedure findings, then the procedure report has been included in a sealed envelope for you to review at your convenience later.  YOU SHOULD EXPECT: Some feelings of bloating in the abdomen. Passage of more gas than usual.  Walking can help get rid of the air that was put into your GI tract during the procedure and reduce the bloating. If you had a lower endoscopy (such as a colonoscopy or flexible sigmoidoscopy) you may notice spotting of blood in your stool or on the toilet paper. If you underwent a bowel prep for your procedure, you may not have a normal bowel movement for a few days.  Please Note:  You might notice some irritation and congestion in your nose or some drainage.  This is from the oxygen used during your procedure.  There is no need for concern and it should clear up in a day or so.  SYMPTOMS TO REPORT IMMEDIATELY:  Following lower endoscopy (colonoscopy or flexible sigmoidoscopy):  Excessive amounts of blood in the stool  Significant tenderness or worsening of abdominal pains  Swelling of the abdomen that is new, acute  Fever of 100F or higher  For  urgent or emergent issues, a gastroenterologist can be reached at any hour by calling [phone]. Do not use MyChart messaging for urgent concerns.    DIET:  We do recommend a small meal at first, but then you may proceed to your regular diet.  Drink plenty of fluids but you should avoid alcoholic beverages for 24 hours.  ACTIVITY:  You should plan to take it easy for the rest of today and you should NOT DRIVE or use heavy machinery until tomorrow (because of the sedation medicines used during the test).    FOLLOW UP: Our staff will call the number listed on your records 48-72 hours following your procedure to check on you and address any questions or concerns that you may have regarding the information given to you following your procedure. If we do not reach you, we will leave a message.  We will attempt to reach you two times.  During this call, we will ask if you have developed any symptoms of COVID 19. If you develop any symptoms (ie: fever, flu-like symptoms, shortness of breath, cough etc.) before then, please call [phone].  If you test positive for Covid 19 in the 2 weeks post procedure, please call and report this information to [place].    If any biopsies were taken you will be contacted by phone or by letter within the next 1-3 weeks.  Please call us at [phone] if you have not heard about the biopsies  in 3 weeks.    SIGNATURES/CONFIDENTIALITY: You and/or your care partner have signed paperwork which will be entered into your electronic medical record.  These signatures attest to the fact that that the information above on your After Visit Summary has been reviewed and is understood.  Full responsibility of the confidentiality of this discharge information lies with you and/or your care-partner.

## 2020-09-19 NOTE — Op Note (Signed)
[facility] Patient Name: Danielle [name] Procedure Date: 09/19/2020 8:07 AM MRN: [id number] Endoscopist: Gatha Mayer , MD Age: 45 Referring MD:  Date of Birth: 23-Mar-1975 Gender: Female Account #: [id number] Procedure:                Colonoscopy Indications:              Screening for colorectal malignant neoplasm, This                            is the patient's first colonoscopy Medicines:                Propofol per Anesthesia, Monitored Anesthesia Care Procedure:                Pre-Anesthesia Assessment:                           - Prior to the procedure, a History and Physical                            was performed, and patient medications and                            allergies were reviewed. The patient's tolerance of                            previous anesthesia was also reviewed. The risks                            and benefits of the procedure and the sedation                            options and risks were discussed with the patient.                            All questions were answered, and informed consent                            was obtained. Prior Anticoagulants: The patient has                            taken no previous anticoagulant or antiplatelet                            agents. ASA Grade Assessment: II - A patient with                            mild systemic disease. After reviewing the risks                            and benefits, the patient was deemed in                            satisfactory condition to undergo the procedure.  After obtaining informed consent, the colonoscope                            was passed under direct vision. Throughout the                            procedure, the patient's blood pressure, pulse, and                            oxygen saturations were monitored continuously. The                            Olympus PCF-H190DL (#[id number]) Colonoscope was                             introduced through the anus and advanced to the the                            cecum, identified by appendiceal orifice and                            ileocecal valve. The colonoscopy was performed with                            moderate difficulty due to restricted mobility of                            the colon and significant looping. Successful                            completion of the procedure was aided by using                            manual pressure and straightening and shortening                            the scope to obtain bowel loop reduction. The                            patient tolerated the procedure well. The quality                            of the bowel preparation was excellent. The                            ileocecal valve, appendiceal orifice, and rectum                            were photographed. The bowel preparation used was                            Miralax via split dose instruction. Scope In: 8:22:35 AM Scope Out: 8:48:05 AM Scope Withdrawal Time: 0 hours 19 minutes 8 seconds  Total Procedure  Duration: 0 hours 25 minutes 30 seconds  Findings:                 Five sessile polyps were found in the ascending                            colon and cecum. The polyps were diminutive in                            size. These polyps were removed with a cold snare.                            Resection and retrieval were complete. Verification                            of patient identification for the specimen was                            done. Estimated blood loss was minimal.                           Multiple diverticula were found in the sigmoid                            colon.                           The exam was otherwise without abnormality on                            direct and retroflexion views.                           Skin tags were found on perianal exam. Complications:            No immediate complications. Estimated Blood Loss:      Estimated blood loss was minimal. Impression:               - Five diminutive polyps in the ascending colon and                            in the cecum, removed with a cold snare. Resected                            and retrieved.                           - Diverticulosis in the sigmoid colon.                           - The examination was otherwise normal on direct                            and retroflexion views.                           -  Perianal skin tags found on perianal exam. Recommendation:           - Patient has a contact number available for                            emergencies. The signs and symptoms of potential                            delayed complications were discussed with the                            patient. Return to normal activities tomorrow.                            Written discharge instructions were provided to the                            patient.                           - Resume previous diet.                           - Continue present medications.                           - Repeat colonoscopy is recommended. The                            colonoscopy date will be determined after pathology                            results from today's exam become available for                            review. Gatha Mayer, MD 09/19/2020 8:59:46 AM This report has been signed electronically.

## 2020-09-19 NOTE — Progress Notes (Signed)
Medical history reviewed with no changes noted. VS assessed by C.W 

## 2020-09-19 NOTE — Progress Notes (Signed)
Called to room to assist during endoscopic procedure.  Patient ID and intended procedure confirmed with present staff. Received instructions for my participation in the procedure from the performing physician.  

## 2020-09-24 ENCOUNTER — Telehealth: Payer: Self-pay | Admitting: *Deleted

## 2020-09-24 NOTE — Telephone Encounter (Signed)
  Follow up Call-  Call back number 09/19/2020  Post procedure Call Back phone  # [phone]  Permission to leave phone message Yes  Some recent data might be hidden     Patient questions:  Do you have a fever, pain , or abdominal swelling? No. Pain Score  0 *  Have you tolerated food without any problems? Yes.    Have you been able to return to your normal activities? Yes.    Do you have any questions about your discharge instructions: Diet   No. Medications  No. Follow up visit  No.  Do you have questions or concerns about your Care? No.  Actions: * If pain score is 4 or above: No action needed, pain <4.  Have you developed a fever since your procedure? no  2.   Have you had an respiratory symptoms (SOB or cough) since your procedure? no  3.   Have you tested positive for COVID 19 since your procedure no  4.   Have you had any family members/close contacts diagnosed with the COVID 19 since your procedure?  no   If yes to any of these questions please route to Joylene John, RN and Joella Prince, RN

## 2020-10-01 LAB — HM MAMMOGRAPHY

## 2020-10-02 ENCOUNTER — Encounter: Payer: Self-pay | Admitting: Internal Medicine

## 2020-10-02 DIAGNOSIS — Z860101 Personal history of adenomatous and serrated colon polyps: Secondary | ICD-10-CM | POA: Insufficient documentation

## 2020-10-02 DIAGNOSIS — Z86010 Personal history of colonic polyps: Secondary | ICD-10-CM

## 2020-10-02 HISTORY — DX: Personal history of colonic polyps: Z86.010

## 2020-10-02 HISTORY — DX: Personal history of adenomatous and serrated colon polyps: Z86.0101

## 2020-10-21 ENCOUNTER — Encounter: Payer: Self-pay | Admitting: Internal Medicine

## 2020-11-25 IMAGING — CR DG LUMBAR SPINE 2-3V
3 series · 3 of 3 positions shown · non-contrast
Comparison: None.

CLINICAL DATA: Right leg pain

EXAM:
LUMBAR SPINE - 2-3 VIEW

[t l-spine a.p.]
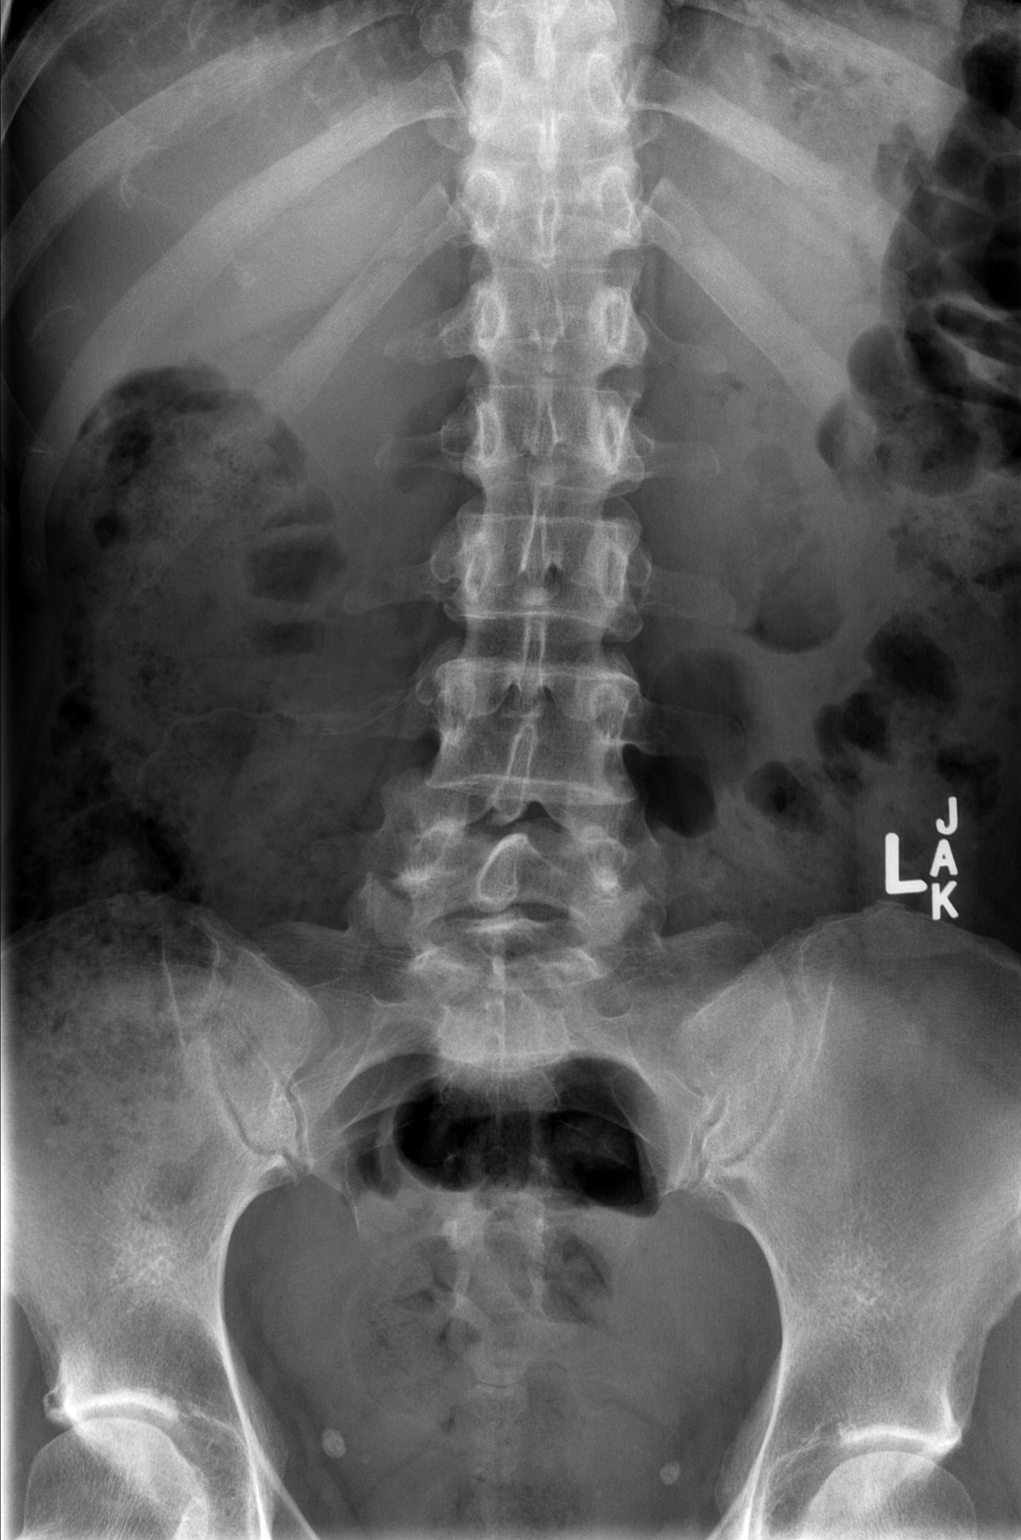

[t l-spine lat]
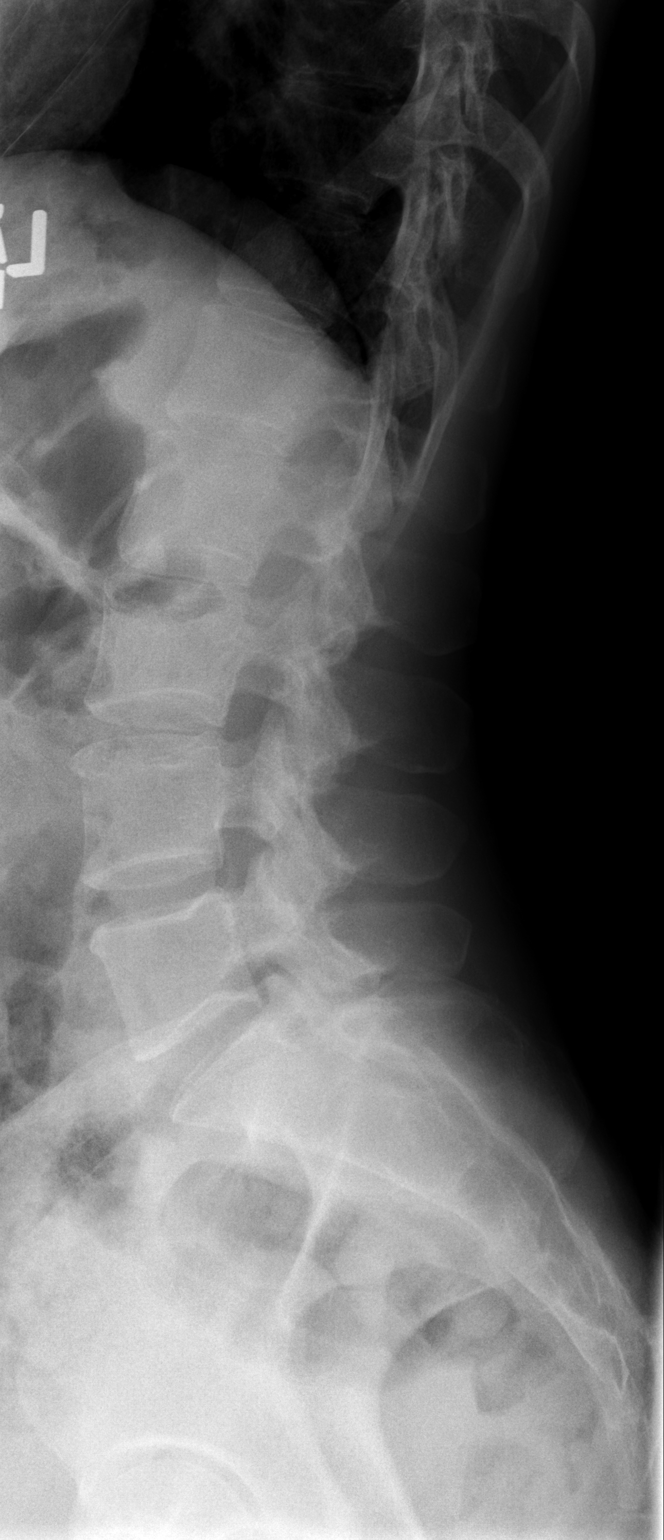

[t l-spine l5-s1 spot]
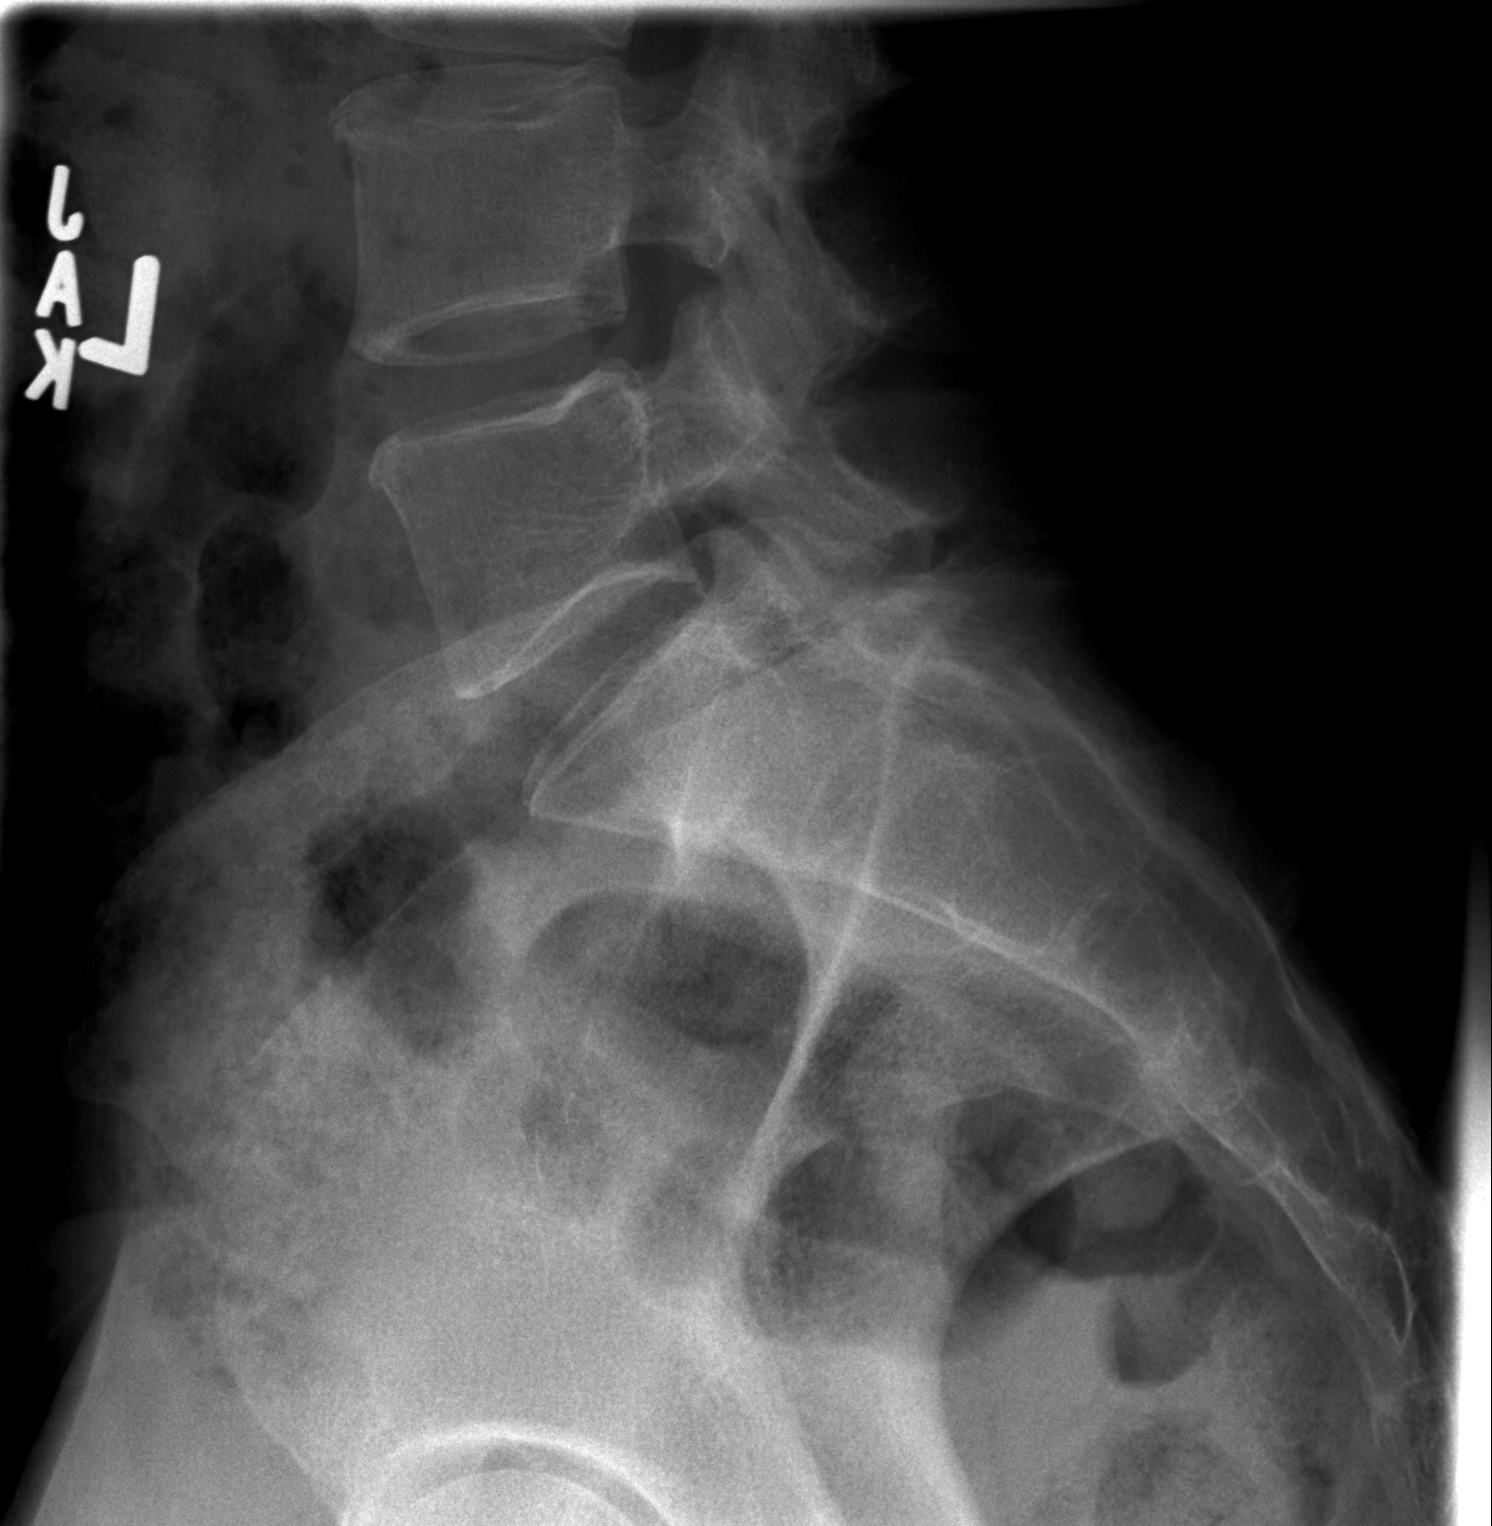

[3 of 3 positions shown; findings below may reference images not displayed]

FINDINGS: Mild levoscoliosis. Normal alignment. Disc spaces maintained. No
degenerative change. No fracture or pars defect.
IMPRESSION: Slight levoscoliosis otherwise negative

## 2021-05-26 ENCOUNTER — Encounter: Payer: Self-pay | Admitting: Internal Medicine

## 2021-05-26 NOTE — Progress Notes (Signed)
Annual Screening/Preventative Visit & Comprehensive Evaluation &  Examination  Future Appointments  Date Time Provider Department  05/27/2021 11:00 AM Unk Pinto, MD [facility]  06/01/2022 10:00 AM Unk Pinto, MD [facility]        This very nice 46 y.o.  MWF presents for a Screening /Preventative Visit & comprehensive evaluation and management of multiple medical co-morbidities.  Patient has been followed for labile HTN, HLD, abnormal Glucose  and Vitamin D Deficiency. Patient also has hx/o migraines        Patient is monitored expectantly for Labile HTN .  In 2021 , patient had a no0rmal Patient's BP has been controlled at home and patient denies any cardiac symptoms as chest pain, palpitations, shortness of breath, dizziness or ankle swelling. Today's BP is  rechecked at goal 110/68 .       Patient's hyperlipidemia is controlled with diet and medications. Patient denies myalgias or other medication SE's. Last lipids were at goal :  Lab Results  Component Value Date   CHOL 190 05/20/2020   HDL 78 05/20/2020   LDLCALC 97 05/20/2020   TRIG 67 05/20/2020   CHOLHDL 2.4 05/20/2020         Patient is monitored expectantly for glucose intolerance  and patient denies reactive hypoglycemic symptoms, visual blurring, diabetic polys or paresthesias. Last A1c was normal & at goal :  Lab Results  Component Value Date   HGBA1C 4.9 05/20/2020         Finally, patient has history of Vitamin D Deficiency ("42" on tx /2016) and last Vitamin D was at goal :  Lab Results  Component Value Date   VD25OH 89 05/20/2020     Current Outpatient Medications on File Prior to Visit  Medication Sig   FIORICET  Take 1 to 2 tablets every 4 to 6 hours for Migraine   VITAMIN D   5,000 Units  Take daily   VITAMIN B12 SL Place 1 tablet under the tongue daily.   famotidine  20 MG tablet OTC Take  as needed for heartburn    Fexofenadine 180 MG tablet Take daily.    MAGNESIUM  Take 1 tablet  daily.   MultiVit-Minerals  Take daily.   Omega-3 FISH OIL Take daily.   Tumeric  1 daily     Allergies  Allergen Reactions   Sulfa Antibiotics    Levaquin [Levofloxacin In D5w] Rash     Past Medical History:  Diagnosis Date   Allergy    GERD (gastroesophageal reflux disease)    Hx of adenomatous colonic sessile serrated polyps 10/02/2020   4 adenomas and 1 ssp   Hx of migraines    "always over left eye"   Parvovirus infection 2013   Shingles 2011   UTI (lower urinary tract infection)    Vitamin D deficiency      Health Maintenance  Topic Date Due   PAP SMEAR-Modifier  Never done   INFLUENZA VACCINE  10/20/2020   COLONOSCOPY (Pts 45-20yr Insurance coverage will need to be confirmed)  09/20/2023   TETANUS/TDAP  06/22/2026   Hepatitis C Screening  Completed   HIV Screening  Completed   HPV VACCINES  Aged Out     Immunization History  Administered Date(s) Administered   Influenza Whole 12/30/2011   Influenza 02/05/2015   Influenza 01/21/2014   PPD Test 03/09/2016, 04/13/2017, 04/28/2018, 05/08/2019, 05/20/2020   Pneumococcal -13 12/30/2011   Td 01/29/2006   Tdap 06/21/2016    Last Colon - 09/19/2020 -  Dr Carlean Purl - 5 polyps - Recc 3 year  f/u - due July 2025   Last MGM - 10/01/2020   Past Surgical History:  Procedure Laterality Date   ESOPHAGOGASTRODUODENOSCOPY ENDOSCOPY  03/22/2010   WISDOM TOOTH EXTRACTION       Family History  Problem Relation Age of Onset   Mitral valve prolapse Mother    Throat cancer Paternal Uncle    Cancer Maternal Grandmother        breast   Cancer Maternal Grandfather        lung   Stomach cancer Paternal Grandmother    Cancer Paternal Grandfather        prostate   Colon cancer Neg Hx    Esophageal cancer Neg Hx    Colon polyps Neg Hx      Social History   Tobacco Use   Smoking status: Never   Smokeless tobacco: Never  Vaping Use   Vaping Use: Never used  Substance Use Topics   Alcohol use: Yes     Alcohol/week: 1.0 standard drink    Types: 1 Glasses of wine per week   Drug use: No      ROS Constitutional: Denies fever, chills, weight loss/gain, headaches, insomnia,  night sweats, and change in appetite. Does c/o fatigue. Eyes: Denies redness, blurred vision, diplopia, discharge, itchy, watery eyes.  ENT: Denies discharge, congestion, post nasal drip, epistaxis, sore throat, earache, hearing loss, dental pain, Tinnitus, Vertigo, Sinus pain, snoring.  Cardio: Denies chest pain, palpitations, irregular heartbeat, syncope, dyspnea, diaphoresis, orthopnea, PND, claudication, edema Respiratory: denies cough, dyspnea, DOE, pleurisy, hoarseness, laryngitis, wheezing.  Gastrointestinal: Denies dysphagia, heartburn, reflux, water brash, pain, cramps, nausea, vomiting, bloating, diarrhea, constipation, hematemesis, melena, hematochezia, jaundice, hemorrhoids Genitourinary: Denies dysuria, frequency, urgency, nocturia, hesitancy, discharge, hematuria, flank pain Breast: Breast lumps, nipple discharge, bleeding.  Musculoskeletal: Denies arthralgia, myalgia, stiffness, Jt. Swelling, pain, limp, and strain/sprain. Denies falls. Skin: Denies puritis, rash, hives, warts, acne, eczema, changing in skin lesion Neuro: No weakness, tremor, incoordination, spasms, paresthesia, pain Psychiatric: Denies confusion, memory loss, sensory loss. Denies Depression. Endocrine: Denies change in weight, skin, hair change, nocturia, and paresthesia, diabetic polys, visual blurring, hyper / hypo glycemic episodes.  Heme/Lymph: No excessive bleeding, bruising, enlarged lymph nodes.  Physical Exam  BP 110/68    Pulse 68    Temp 97.9 F (36.6 C)    Resp 16    Ht '5\' 6"'$  (1.676 m)    Wt 127 lb 12.8 oz (58 kg)    SpO2 98%    BMI 20.63 kg/m   General Appearance: Well nourished, well groomed and in no apparent distress.  Eyes: PERRLA, EOMs, conjunctiva no swelling or erythema, normal fundi and vessels. Sinuses: No  frontal/maxillary tenderness ENT/Mouth: EACs patent / TMs  nl. Nares clear without erythema, swelling, mucoid exudates. Oral hygiene is good. No erythema, swelling, or exudate. Tongue normal, non-obstructing. Tonsils not swollen or erythematous. Hearing normal.  Neck: Supple, thyroid not palpable. No bruits, nodes or JVD. Respiratory: Respiratory effort normal.  BS equal and clear bilateral without rales, rhonci, wheezing or stridor. Cardio: Heart sounds are normal with regular rate and rhythm and no murmurs, rubs or gallops. Peripheral pulses are normal and equal bilaterally without edema. No aortic or femoral bruits. Chest: symmetric with normal excursions and percussion. Abdomen: Flat, soft with bowel sounds active. Nontender, no guarding, rebound, hernias, masses, or organomegaly.  Lymphatics: Non tender without lymphadenopathy.  Musculoskeletal: Full ROM all peripheral extremities, joint stability, 5/5 strength, and normal  gait. Skin: Warm and dry without rashes, lesions, cyanosis, clubbing or  ecchymosis.  Neuro: Cranial nerves intact, reflexes equal bilaterally. Normal muscle tone, no cerebellar symptoms. Sensation intact.  Pysch: Alert and oriented X 3, normal affect, Insight and Judgment appropriate.    Assessment and Plan  1. Annual Preventative Screening Examination   2. Elevated BP without diagnosis of hypertension  - EKG 12-Lead - COMPLETE METABOLIC PANEL WITH GFR - Magnesium - TSH  3. Hyperlipidemia, mixed  - EKG 12-Lead - Lipid panel - TSH  4. Abnormal glucose  - EKG 12-Lead - Hemoglobin A1c - Insulin, random  5. Vitamin D deficiency  - VITAMIN D 25 Hydroxy  6. Migraine    7. Gastroesophageal reflux disease without esophagitis  - CBC with Differential/Platelet  8. Screening for colorectal cancer  - POC Hemoccult Bld/Stl   9. Screening examination for pulmonary tuberculosis  - TB Skin Test  10. Screening for ischemic heart disease  - EKG  12-Lead  11. FHx: heart disease  - EKG 12-Lead  12. Fatigue, unspecified type  - Iron, Total/Total Iron Binding Cap - Vitamin B12 - CBC with Differential/Platelet  13. Medication management  - Urinalysis, Routine w reflex microscopic - Microalbumin / creatinine urine ratio - CBC with Differential/Platelet - COMPLETE METABOLIC PANEL WITH GFR - Magnesium - Lipid panel - TSH - Hemoglobin A1c - Insulin, random - VITAMIN D 25 Hydroxy         Patient was counseled in prudent diet to achieve/maintain BMI less than 25 for weight control, BP monitoring, regular exercise and medications. Discussed med's effects and SE's. Screening labs and tests as requested with regular follow-up as recommended. Over 40 minutes of exam, counseling, chart review and high complex critical decision making was performed.   Kirtland Bouchard, MD

## 2021-05-26 NOTE — Patient Instructions (Signed)
Due to recent changes in healthcare laws, you may see the results of your imaging and laboratory studies on MyChart before your provider has had a chance to review them.  We understand that in some cases there may be results that are confusing or concerning to you. Not all laboratory results come back in the same time frame and the provider may be waiting for multiple results in order to interpret others.  Please give [place] 48 hours in order for your provider to thoroughly review all the results before contacting the office for clarification of your results.   ++++++++++++++++++++++++++++++  Vit D  & Vit C 1,000 mg   are recommended to help protect  against the Covid-19 and other Corona viruses.    Also it's recommended  to take  Zinc 50 mg  to help  protect against the Covid-19   and best place to get  is also on [email or web address]  and don't pay more than 6-8 cents /pill !  ================================ Coronavirus (COVID-19) Are you at risk?  Are you at risk for the Coronavirus (COVID-19)?  To be considered HIGH RISK for Coronavirus (COVID-19), you have to meet the following criteria:  Traveled to [place], [place], [place], [place] or [place]; or in the [place] to [place], [place], [place]  or [place]; and have fever, cough, and shortness of breath within the last 2 weeks of travel OR Been in close contact with a person diagnosed with COVID-19 within the last 2 weeks and have  fever, cough,and shortness of breath  IF YOU DO NOT MEET THESE CRITERIA, YOU ARE CONSIDERED LOW RISK FOR COVID-19.  What to do if you are HIGH RISK for COVID-19?  If you are having a medical emergency, call 911. Seek medical care right away. Before you go to a doctors office, urgent care or emergency department,  call ahead and tell them about your recent travel, contact with someone diagnosed with COVID-19   and your symptoms.  You should receive instructions from your physicians office regarding  next steps of care.  When you arrive at healthcare provider, tell the healthcare staff immediately you have returned from  visiting [place], [place], [place], [place] or [place]; or traveled in the [place] to [place], [place],  [place] or [place] in the last two weeks or you have been in close contact with a person diagnosed with  COVID-19 in the last 2 weeks.   Tell the health care staff about your symptoms: fever, cough and shortness of breath. After you have been seen by a medical provider, you will be either: Tested for (COVID-19) and discharged home on quarantine except to seek medical care if  symptoms worsen, and asked to  Stay home and avoid contact with others until you get your results (4-5 days)  Avoid travel on public transportation if possible (such as bus, train, or airplane) or Sent to the Emergency Department by EMS for evaluation, COVID-19 testing  and  possible admission depending on your condition and test results.  What to do if you are LOW RISK for COVID-19?  Reduce your risk of any infection by using the same precautions used for avoiding the common cold or flu:  Wash your hands often with soap and warm water for at least 20 seconds.  If soap and water are not readily available,  use an alcohol-based hand sanitizer with at least 60% alcohol.  If coughing or sneezing, cover your mouth and nose by  or sneezing into the elbow areas of your shirt or coat,  into a tissue or into your sleeve (not your hands). Avoid shaking hands with others and consider head nods or verbal greetings only. Avoid touching your eyes, nose, or mouth with unwashed hands.  Avoid close contact with people who are sick. Avoid places or events with large numbers of people in one location, like concerts or sporting events. Carefully consider travel plans you have or are making. If you are planning any travel outside or inside the [place], visit the CDC's Travelers' Health webpage for  the latest health notices. If you have some symptoms but not all symptoms, continue to monitor at home and seek medical attention  if your symptoms worsen. If you are having a medical emergency, call 911. >>>>>>>>>>>>>>>>>>>>>>> Preventive Care for Adults  A healthy lifestyle and preventive care can promote health and wellness. Preventive health guidelines for women include the following key practices. A routine yearly physical is a good way to check with your health care provider about your health and preventive screening. It is a chance to share any concerns and updates on your health and to receive a thorough exam. Visit your dentist for a routine exam and preventive care every 6 months. Brush your teeth twice a day and floss once a day. Good oral hygiene prevents tooth decay and gum disease. The frequency of eye exams is based on your age, health, family medical history, use of contact lenses, and other factors. Follow your health care provider's recommendations for frequency of eye exams. Eat a healthy diet. Foods like vegetables, fruits, whole grains, low-fat dairy products, and lean protein foods contain the nutrients you need without too many calories. Decrease your intake of foods high in solid fats, added sugars, and salt. Eat the right amount of calories for you. Get information about a proper diet from your health care provider, if necessary. Regular physical exercise is one of the most important things you can do for your health. Most adults should get at least 150 minutes of moderate-intensity exercise (any activity that increases your heart rate and causes you to sweat) each week. In addition, most adults need muscle-strengthening exercises on 2 or more days a week. Maintain a healthy weight. The body mass index (BMI) is a screening tool to identify possible weight problems. It provides an estimate of body fat based on height and weight. Your health care provider can find your BMI and can  help you achieve or maintain a healthy weight. For adults 20 years and older: A BMI below 18.5 is considered underweight. A BMI of 18.5 to 24.9 is normal. A BMI of 25 to 29.9 is considered overweight. A BMI of 30 and above is considered obese. Maintain normal blood lipids and cholesterol levels by exercising and minimizing your intake of saturated fat. Eat a balanced diet with plenty of fruit and vegetables. Blood tests for lipids and cholesterol should begin at age 20 and be repeated every 5 years. If your lipid or cholesterol levels are high, you are over 50, or you are at high risk for heart disease, you may need your cholesterol levels checked more frequently. Ongoing high lipid and cholesterol levels should be treated with medicines if diet and exercise are not working. If you smoke, find out from your health care provider how to quit. If you do not use tobacco, do not start. Lung cancer screening is recommended for adults aged 55-80 years who are at high risk for developing   developing lung cancer because of a history of smoking. A yearly low-dose CT scan of the lungs is recommended for people who have at least a 30-pack-year history of smoking and are a current smoker or have quit within the past 15 years. A pack year of smoking is smoking an average of 1 pack of cigarettes a day for 1 year (for example: 1 pack a day for 30 years or 2 packs a day for 15 years). Yearly screening should continue until the smoker has stopped smoking for at least 15 years. Yearly screening should be stopped for people who develop a health problem that would prevent them from having lung cancer treatment. High blood pressure causes heart disease and increases the risk of stroke. Your blood pressure should be checked at least every 1 to 2 years. Ongoing high blood pressure should be treated with medicines if weight loss and exercise do not work. If you are 67-36 years old, ask your health care provider if you should take aspirin to  prevent strokes. Diabetes screening involves taking a blood sample to check your fasting blood sugar level. This should be done once every 3 years, after age 84, if you are within normal weight and without risk factors for diabetes. Testing should be considered at a younger age or be carried out more frequently if you are overweight and have at least 1 risk factor for diabetes. Breast cancer screening is essential preventive care for women. You should practice "breast self-awareness." This means understanding the normal appearance and feel of your breasts and may include breast self-examination. Any changes detected, no matter how small, should be reported to a health care provider. Women in their 67s and 30s should have a clinical breast exam (CBE) by a health care provider as part of a regular health exam every 1 to 3 years. After age 53, women should have a CBE every year. Starting at age 68, women should consider having a mammogram (breast X-ray test) every year. Women who have a family history of breast cancer should talk to their health care provider about genetic screening. Women at a high risk of breast cancer should talk to their health care providers about having an MRI and a mammogram every year. Breast cancer gene (BRCA)-related cancer risk assessment is recommended for women who have family members with BRCA-related cancers. BRCA-related cancers include breast, ovarian, tubal, and peritoneal cancers. Having family members with these cancers may be associated with an increased risk for harmful changes (mutations) in the breast cancer genes BRCA1 and BRCA2. Results of the assessment will determine the need for genetic counseling and BRCA1 and BRCA2 testing. Routine pelvic exams to screen for cancer are no longer recommended for nonpregnant women who are considered low risk for cancer of the pelvic organs (ovaries, uterus, and vagina) and who do not have symptoms. Ask your health care provider if a  screening pelvic exam is right for you. If you have had past treatment for cervical cancer or a condition that could lead to cancer, you need Pap tests and screening for cancer for at least 20 years after your treatment. If Pap tests have been discontinued, your risk factors (such as having a new sexual partner) need to be reassessed to determine if screening should be resumed. Some women have medical problems that increase the chance of getting cervical cancer. In these cases, your health care provider may recommend more frequent screening and Pap tests. Colorectal cancer can be detected and often prevented. Most routine  colorectal cancer screening begins at the age of 48 years and continues through age 29 years. However, your health care provider may recommend screening at an earlier age if you have risk factors for colon cancer. On a yearly basis, your health care provider may provide home test kits to check for hidden blood in the stool. Use of a small camera at the end of a tube, to directly examine the colon (sigmoidoscopy or colonoscopy), can detect the earliest forms of colorectal cancer. Talk to your health care provider about this at age 12, when routine screening begins.  Direct exam of the colon should be repeated every 5-10 years through age 26 years, unless early forms of pre-cancerous polyps or small growths are found. Hepatitis C blood testing is recommended for all people born from [birth year] through 1965 and any individual with known risks for hepatitis C.  Osteoporosis is a disease in which the bones lose minerals and strength with aging. This can result in serious bone fractures or breaks. The risk of osteoporosis can be identified using a bone density scan. Women ages 30 years and over and women at risk for fractures or osteoporosis should discuss screening with their health care providers. Ask your health care provider whether you should take a calcium supplement or vitamin D to reduce the rate  of osteoporosis. Menopause can be associated with physical symptoms and risks. Hormone replacement therapy is available to decrease symptoms and risks. You should talk to your health care provider about whether hormone replacement therapy is right for you. Use sunscreen. Apply sunscreen liberally and repeatedly throughout the day. You should seek shade when your shadow is shorter than you. Protect yourself by wearing long sleeves, pants, a wide-brimmed hat, and sunglasses year round, whenever you are outdoors. Once a month, do a whole body skin exam, using a mirror to look at the skin on your back. Tell your health care provider of new moles, moles that have irregular borders, moles that are larger than a pencil eraser, or moles that have changed in shape or color. Stay current with required vaccines (immunizations). Influenza vaccine. All adults should be immunized every year. Tetanus, diphtheria, and acellular pertussis (Td, Tdap) vaccine. Pregnant women should receive 1 dose of Tdap vaccine during each pregnancy. The dose should be obtained regardless of the length of time since the last dose. Immunization is preferred during the 27th-36th week of gestation. An adult who has not previously received Tdap or who does not know her vaccine status should receive 1 dose of Tdap. This initial dose should be followed by tetanus and diphtheria toxoids (Td) booster doses every 10 years. Adults with an unknown or incomplete history of completing a 3-dose immunization series with Td-containing vaccines should begin or complete a primary immunization series including a Tdap dose. Adults should receive a Td booster every 10 years. Varicella vaccine. An adult without evidence of immunity to varicella should receive 2 doses or a second dose if she has previously received 1 dose. Pregnant females who do not have evidence of immunity should receive the first dose after pregnancy. This first dose should be obtained before  leaving the health care facility. The second dose should be obtained 4-8 weeks after the first dose. Human papillomavirus (HPV) vaccine. Females aged 13-26 years who have not received the vaccine previously should obtain the 3-dose series. The vaccine is not recommended for use in pregnant females. However, pregnancy testing is not needed before receiving a dose. If a female is found  be pregnant after receiving a dose, no treatment is needed. In that case, the remaining doses should be delayed until after the pregnancy. Immunization is recommended for any person with an immunocompromised condition through the age of 26 years if she did not get any or all doses earlier. During the 3-dose series, the second dose should be obtained 4-8 weeks after the first dose. The third dose should be obtained 24 weeks after the first dose and 16 weeks after the second dose. Zoster vaccine. One dose is recommended for adults aged 60 years or older unless certain conditions are present. Measles, mumps, and rubella (MMR) vaccine. Adults born before 1957 generally are considered immune to measles and mumps. Adults born in 1957 or later should have 1 or more doses of MMR vaccine unless there is a contraindication to the vaccine or there is laboratory evidence of immunity to each of the three diseases. A routine second dose of MMR vaccine should be obtained at least 28 days after the first dose for students attending postsecondary schools, health care workers, or international travelers. People who received inactivated measles vaccine or an unknown type of measles vaccine during 1963-1967 should receive 2 doses of MMR vaccine. People who received inactivated mumps vaccine or an unknown type of mumps vaccine before 1979 and are at high risk for mumps infection should consider immunization with 2 doses of MMR vaccine. For females of childbearing age, rubella immunity should be determined. If there is no evidence of immunity, females  who are not pregnant should be vaccinated. If there is no evidence of immunity, females who are pregnant should delay immunization until after pregnancy. Unvaccinated health care workers born before 1957 who lack laboratory evidence of measles, mumps, or rubella immunity or laboratory confirmation of disease should consider measles and mumps immunization with 2 doses of MMR vaccine or rubella immunization with 1 dose of MMR vaccine. Pneumococcal 13-valent conjugate (PCV13) vaccine. When indicated, a person who is uncertain of her immunization history and has no record of immunization should receive the PCV13 vaccine. An adult aged 19 years or older who has certain medical conditions and has not been previously immunized should receive 1 dose of PCV13 vaccine. This PCV13 should be followed with a dose of pneumococcal polysaccharide (PPSV23) vaccine. The PPSV23 vaccine dose should be obtained at least 1 or more year(s) after the dose of PCV13 vaccine. An adult aged 19 years or older who has certain medical conditions and previously received 1 or more doses of PPSV23 vaccine should receive 1 dose of PCV13. The PCV13 vaccine dose should be obtained 1 or more years after the last PPSV23 vaccine dose.  Pneumococcal polysaccharide (PPSV23) vaccine. When PCV13 is also indicated, PCV13 should be obtained first. All adults aged 65 years and older should be immunized. An adult younger than age 65 years who has certain medical conditions should be immunized. Any person who resides in a nursing home or long-term care facility should be immunized. An adult smoker should be immunized. People with an immunocompromised condition and certain other conditions should receive both PCV13 and PPSV23 vaccines. People with human immunodeficiency virus (HIV) infection should be immunized as soon as possible after diagnosis. Immunization during chemotherapy or radiation therapy should be avoided. Routine use of PPSV23 vaccine is not  recommended for American Indians, [place] Natives, or people younger than 65 years unless there are medical conditions that require PPSV23 vaccine. When indicated, people who have unknown immunization and have no record of immunization should receive   PPSV23 vaccine. One-time revaccination 5 years after the first dose of PPSV23 is recommended for people aged 19-64 years who have chronic kidney failure, nephrotic syndrome, asplenia, or immunocompromised conditions. People who received 1-2 doses of PPSV23 before age 65 years should receive another dose of PPSV23 vaccine at age 65 years or later if at least 5 years have passed since the previous dose. Doses of PPSV23 are not needed for people immunized with PPSV23 at or after age 65 years.  Preventive Services / Frequency  Ages 40 to 64 years Blood pressure check. Lipid and cholesterol check. Lung cancer screening. / Every year if you are aged 55-80 years and have a 30-pack-year history of smoking and currently smoke or have quit within the past 15 years. Yearly screening is stopped once you have quit smoking for at least 15 years or develop a health problem that would prevent you from having lung cancer treatment. Clinical breast exam.** / Every year after age 40 years.  BRCA-related cancer risk assessment.** / For women who have family members with a BRCA-related cancer (breast, ovarian, tubal, or peritoneal cancers). Mammogram.** / Every year beginning at age 40 years and continuing for as long as you are in good health. Consult with your health care provider. Pap test.** / Every 3 years starting at age 30 years through age 65 or 70 years with a history of 3 consecutive normal Pap tests. HPV screening.** / Every 3 years from ages 30 years through ages 65 to 70 years with a history of 3 consecutive normal Pap tests. Fecal occult blood test (FOBT) of stool. / Every year beginning at age 50 years and continuing until age 75 years. You may not need to do this  test if you get a colonoscopy every 10 years. Flexible sigmoidoscopy or colonoscopy.** / Every 5 years for a flexible sigmoidoscopy or every 10 years for a colonoscopy beginning at age 50 years and continuing until age 75 years. Hepatitis C blood test.** / For all people born from 1945 through 1965 and any individual with known risks for hepatitis C. Skin self-exam. / Monthly. Influenza vaccine. / Every year. Tetanus, diphtheria, and acellular pertussis (Tdap/Td) vaccine.** / Consult your health care provider. Pregnant women should receive 1 dose of Tdap vaccine during each pregnancy. 1 dose of Td every 10 years. Varicella vaccine.** / Consult your health care provider. Pregnant females who do not have evidence of immunity should receive the first dose after pregnancy. Zoster vaccine.** / 1 dose for adults aged 60 years or older. Pneumococcal 13-valent conjugate (PCV13) vaccine.** / Consult your health care provider. Pneumococcal polysaccharide (PPSV23) vaccine.** / 1 to 2 doses if you smoke cigarettes or if you have certain conditions. Meningococcal vaccine.** / Consult your health care provider. Hepatitis A vaccine.** / Consult your health care provider. Hepatitis B vaccine.** / Consult your health care provider. Screening for abdominal aortic aneurysm (AAA)  by ultrasound is recommended for people over 50 who have history of high blood pressure or who are current or former smokers. ++++++++++++++++++ Recommend Adult Low Dose Aspirin or  coated  Aspirin 81 mg daily  To reduce risk of Colon Cancer 40 %,  Skin Cancer 26 % ,  Melanoma 46%  and  Pancreatic cancer 60% +++++++++++++++++++ Vitamin D goal  is between 70-100.  Please make sure that you are taking your Vitamin D as directed.  It is very important as a natural anti-inflammatory  helping hair, skin, and nails, as well as reducing stroke and heart   attack risk.  It helps your bones and helps with mood. It also decreases numerous  cancer risks so please take it as directed.  Low Vit D is associated with a 200-300% higher risk for CANCER  and 200-300% higher risk for HEART   ATTACK  &  STROKE.   ...................................... It is also associated with higher death rate at younger ages,  autoimmune diseases like Rheumatoid arthritis, Lupus, Multiple Sclerosis.    Also many other serious conditions, like depression, Alzheimer's Dementia, infertility, muscle aches, fatigue, fibromyalgia - just to name a few. ++++++++++++++++++ Recommend the book "The END of DIETING" by Dr Joel Fuhrman  & the book "The END of DIABETES " by Dr Joel Fuhrman At [employer] - get book & Audio CD's    Being diabetic has a  300% increased risk for heart attack, stroke, cancer, and alzheimer- type vascular dementia. It is very important that you work harder with diet by avoiding all foods that are white. Avoid white rice (brown & wild rice is OK), white potatoes (sweetpotatoes in moderation is OK), White bread or wheat bread or anything made out of white flour like bagels, donuts, rolls, buns, biscuits, cakes, pastries, cookies, pizza crust, and pasta (made from white flour & egg whites) - vegetarian pasta or spinach or wheat pasta is OK. Multigrain breads like Arnold's or [employer], or multigrain sandwich thins or flatbreads.  Diet, exercise and weight loss can reverse and cure diabetes in the early stages.  Diet, exercise and weight loss is very important in the control and prevention of complications of diabetes which affects every system in your body, ie. Brain - dementia/stroke, eyes - glaucoma/blindness, heart - heart attack/heart failure, kidneys - dialysis, stomach - gastric paralysis, intestines - malabsorption, nerves - severe painful neuritis, circulation - gangrene & loss of a leg(s), and finally cancer and Alzheimers.    I recommend avoid fried & greasy foods,  sweets/candy, white rice (brown or wild rice or Quinoa is OK), white  potatoes (sweet potatoes are OK) - anything made from white flour - bagels, doughnuts, rolls, buns, biscuits,white and wheat breads, pizza crust and traditional pasta made of white flour & egg white(vegetarian pasta or spinach or wheat pasta is OK).  Multi-grain bread is OK - like multi-grain flat bread or sandwich thins. Avoid alcohol in excess. Exercise is also important.    Eat all the vegetables you want - avoid meat, especially red meat and dairy - especially cheese.  Cheese is the most concentrated form of trans-fats which is the worst thing to clog up our arteries. Veggie cheese is OK which can be found in the fresh produce section at [employer] or [employer] or [employer]  ++++++++++++++++++++++ DASH Eating Plan  DASH stands for "Dietary Approaches to Stop Hypertension."   The DASH eating plan is a healthy eating plan that has been shown to reduce high blood pressure (hypertension). Additional health benefits may include reducing the risk of type 2 diabetes mellitus, heart disease, and stroke. The DASH eating plan may also help with weight loss. WHAT DO I NEED TO KNOW ABOUT THE DASH EATING PLAN? For the DASH eating plan, you will follow these general guidelines: Choose foods with a percent daily value for sodium of less than 5% (as listed on the food label). Use salt-free seasonings or herbs instead of table salt or sea salt. Check with your health care provider or pharmacist before using salt substitutes. Eat lower-sodium products, often labeled as "lower sodium" or "no   salt added." Eat fresh foods. Eat more vegetables, fruits, and low-fat dairy products. Choose whole grains. Look for the word "whole" as the first word in the ingredient list. Choose fish  Limit sweets, desserts, sugars, and sugary drinks. Choose heart-healthy fats. Eat veggie cheese  Eat more home-cooked food and less restaurant, buffet, and fast food. Limit fried foods. Cook foods using methods other than  frying. Limit canned vegetables. If you do use them, rinse them well to decrease the sodium. When eating at a restaurant, ask that your food be prepared with less salt, or no salt if possible.                      WHAT FOODS CAN I EAT? Read Dr Joel Fuhrman's books on The End of Dieting & The End of Diabetes  Grains Whole grain or whole wheat bread. Brown rice. Whole grain or whole wheat pasta. Quinoa, bulgur, and whole grain cereals. Low-sodium cereals. Corn or whole wheat flour tortillas. Whole grain cornbread. Whole grain crackers. Low-sodium crackers.  Vegetables Fresh or frozen vegetables (raw, steamed, roasted, or grilled). Low-sodium or reduced-sodium tomato and vegetable juices. Low-sodium or reduced-sodium tomato sauce and paste. Low-sodium or reduced-sodium canned vegetables.   Fruits All fresh, canned (in natural juice), or frozen fruits.  Protein Products  All fish and seafood.  Dried beans, peas, or lentils. Unsalted nuts and seeds. Unsalted canned beans.  Dairy Low-fat dairy products, such as skim or 1% milk, 2% or reduced-fat cheeses, low-fat ricotta or cottage cheese, or plain low-fat yogurt. Low-sodium or reduced-sodium cheeses.  Fats and Oils Tub margarines without trans fats. Light or reduced-fat mayonnaise and salad dressings (reduced sodium). Avocado. Safflower, olive, or canola oils. Natural peanut or almond butter.  Other Unsalted popcorn and pretzels. The items listed above may not be a complete list of recommended foods or beverages. Contact your dietitian for more options.  ++++++++++++++++++  WHAT FOODS ARE NOT RECOMMENDED? Grains/ White flour or wheat flour White bread. White pasta. White rice. Refined cornbread. Bagels and croissants. Crackers that contain trans fat.  Vegetables  Creamed or fried vegetables. Vegetables in a . Regular canned vegetables. Regular canned tomato sauce and paste. Regular tomato and vegetable juices.  Fruits Dried fruits.  Canned fruit in light or heavy syrup. Fruit juice.  Meat and Other Protein Products Meat in general - RED meat & White meat.  Fatty cuts of meat. Ribs, chicken wings, all processed meats as bacon, sausage, bologna, salami, fatback, hot dogs, bratwurst and packaged luncheon meats.  Dairy Whole or 2% milk, cream, half-and-half, and cream cheese. Whole-fat or sweetened yogurt. Full-fat cheeses or blue cheese. Non-dairy creamers and whipped toppings. Processed cheese, cheese spreads, or cheese curds.  Condiments Onion and garlic salt, seasoned salt, table salt, and sea salt. Canned and packaged gravies. Worcestershire sauce. Tartar sauce. Barbecue sauce. Teriyaki sauce. Soy sauce, including reduced sodium. Steak sauce. Fish sauce. Oyster sauce. Cocktail sauce. Horseradish. Ketchup and mustard. Meat flavorings and tenderizers. Bouillon cubes. Hot sauce. Tabasco sauce. Marinades. Taco seasonings. Relishes.  Fats and Oils Butter, stick margarine, lard, shortening and bacon fat. Coconut, palm kernel, or palm oils. Regular salad dressings.  Pickles and olives. Salted popcorn and pretzels.  The items listed above may not be a complete list of foods and beverages to avoid.   

## 2021-05-27 ENCOUNTER — Encounter: Payer: Self-pay | Admitting: Internal Medicine

## 2021-05-27 ENCOUNTER — Other Ambulatory Visit: Payer: Self-pay

## 2021-05-27 ENCOUNTER — Ambulatory Visit (INDEPENDENT_AMBULATORY_CARE_PROVIDER_SITE_OTHER): Payer: BC Managed Care – PPO | Admitting: Internal Medicine

## 2021-05-27 VITALS — BP 110/68 | HR 68 | Temp 97.9°F | Resp 16 | Ht 66.0 in | Wt 127.8 lb

## 2021-05-27 DIAGNOSIS — Z136 Encounter for screening for cardiovascular disorders: Secondary | ICD-10-CM

## 2021-05-27 DIAGNOSIS — R5383 Other fatigue: Secondary | ICD-10-CM

## 2021-05-27 DIAGNOSIS — R7309 Other abnormal glucose: Secondary | ICD-10-CM

## 2021-05-27 DIAGNOSIS — Z131 Encounter for screening for diabetes mellitus: Secondary | ICD-10-CM | POA: Diagnosis not present

## 2021-05-27 DIAGNOSIS — K219 Gastro-esophageal reflux disease without esophagitis: Secondary | ICD-10-CM

## 2021-05-27 DIAGNOSIS — R030 Elevated blood-pressure reading, without diagnosis of hypertension: Secondary | ICD-10-CM | POA: Diagnosis not present

## 2021-05-27 DIAGNOSIS — E782 Mixed hyperlipidemia: Secondary | ICD-10-CM

## 2021-05-27 DIAGNOSIS — E559 Vitamin D deficiency, unspecified: Secondary | ICD-10-CM | POA: Diagnosis not present

## 2021-05-27 DIAGNOSIS — Z79899 Other long term (current) drug therapy: Secondary | ICD-10-CM

## 2021-05-27 DIAGNOSIS — Z13220 Encounter for screening for lipoid disorders: Secondary | ICD-10-CM

## 2021-05-27 DIAGNOSIS — Z1389 Encounter for screening for other disorder: Secondary | ICD-10-CM | POA: Diagnosis not present

## 2021-05-27 DIAGNOSIS — Z130 Encounter for screening for diseases of the blood and blood-forming organs and certain disorders involving the immune mechanism: Secondary | ICD-10-CM | POA: Diagnosis not present

## 2021-05-27 DIAGNOSIS — Z1212 Encounter for screening for malignant neoplasm of rectum: Secondary | ICD-10-CM

## 2021-05-27 DIAGNOSIS — Z0000 Encounter for general adult medical examination without abnormal findings: Secondary | ICD-10-CM

## 2021-05-27 DIAGNOSIS — Z111 Encounter for screening for respiratory tuberculosis: Secondary | ICD-10-CM | POA: Diagnosis not present

## 2021-05-27 DIAGNOSIS — Z1211 Encounter for screening for malignant neoplasm of colon: Secondary | ICD-10-CM

## 2021-05-27 DIAGNOSIS — G43009 Migraine without aura, not intractable, without status migrainosus: Secondary | ICD-10-CM

## 2021-05-27 DIAGNOSIS — Z0001 Encounter for general adult medical examination with abnormal findings: Secondary | ICD-10-CM

## 2021-05-27 DIAGNOSIS — Z8249 Family history of ischemic heart disease and other diseases of the circulatory system: Secondary | ICD-10-CM

## 2021-05-28 LAB — CBC WITH DIFFERENTIAL/PLATELET
Absolute Monocytes: 414 cells/uL (ref 200–950)
Basophils Absolute: 28 cells/uL (ref 0–200)
Basophils Relative: 0.4 %
Eosinophils Absolute: 48 cells/uL (ref 15–500)
Eosinophils Relative: 0.7 %
HCT: 40.1 % (ref 35.0–45.0)
Hemoglobin: 13.4 g/dL (ref 11.7–15.5)
Lymphs Abs: 1111 cells/uL (ref 850–3900)
MCH: 32.3 pg (ref 27.0–33.0)
MCHC: 33.4 g/dL (ref 32.0–36.0)
MCV: 96.6 fL (ref 80.0–100.0)
MPV: 12.1 fL (ref 7.5–12.5)
Monocytes Relative: 6 %
Neutro Abs: 5299 cells/uL (ref 1500–7800)
Neutrophils Relative %: 76.8 %
Platelets: 237 10*3/uL (ref 140–400)
RBC: 4.15 10*6/uL (ref 3.80–5.10)
RDW: 11 % (ref 11.0–15.0)
Total Lymphocyte: 16.1 %
WBC: 6.9 10*3/uL (ref 3.8–10.8)

## 2021-05-28 LAB — URINALYSIS, ROUTINE W REFLEX MICROSCOPIC
Bacteria, UA: NONE SEEN /HPF
Bilirubin Urine: NEGATIVE
Glucose, UA: NEGATIVE
Hyaline Cast: NONE SEEN /LPF
Ketones, ur: NEGATIVE
Leukocytes,Ua: NEGATIVE
Nitrite: NEGATIVE
Protein, ur: NEGATIVE
RBC / HPF: NONE SEEN /HPF (ref 0–2)
Specific Gravity, Urine: 1.009 (ref 1.001–1.035)
WBC, UA: NONE SEEN /HPF (ref 0–5)
pH: 6 (ref 5.0–8.0)

## 2021-05-28 LAB — IRON, TOTAL/TOTAL IRON BINDING CAP
%SAT: 25 % (calc) (ref 16–45)
Iron: 105 ug/dL (ref 40–190)
TIBC: 415 mcg/dL (calc) (ref 250–450)

## 2021-05-28 LAB — HEMOGLOBIN A1C
Hgb A1c MFr Bld: 5 % of total Hgb (ref <5.7)
Mean Plasma Glucose: 97 mg/dL
eAG (mmol/L): 5.4 mmol/L

## 2021-05-28 LAB — COMPLETE METABOLIC PANEL WITH GFR
AG Ratio: 1.8 (calc) (ref 1.0–2.5)
ALT: 11 U/L (ref 6–29)
AST: 15 U/L (ref 10–35)
Albumin: 4.4 g/dL (ref 3.6–5.1)
Alkaline phosphatase (APISO): 42 U/L (ref 31–125)
BUN/Creatinine Ratio: 13 (calc) (ref 6–22)
BUN: 13 mg/dL (ref 7–25)
CO2: 27 mmol/L (ref 20–32)
Calcium: 9.7 mg/dL (ref 8.6–10.2)
Chloride: 102 mmol/L (ref 98–110)
Creat: 1 mg/dL — ABNORMAL HIGH (ref 0.50–0.99)
Globulin: 2.5 g/dL (calc) (ref 1.9–3.7)
Glucose, Bld: 86 mg/dL (ref 65–99)
Potassium: 4.2 mmol/L (ref 3.5–5.3)
Sodium: 138 mmol/L (ref 135–146)
Total Bilirubin: 0.8 mg/dL (ref 0.2–1.2)
Total Protein: 6.9 g/dL (ref 6.1–8.1)
eGFR: 70 mL/min/{1.73_m2} (ref >=60)

## 2021-05-28 LAB — LIPID PANEL
Cholesterol: 203 mg/dL — ABNORMAL HIGH (ref <200)
HDL: 87 mg/dL (ref >=50)
LDL Cholesterol (Calc): 101 mg/dL (calc) — ABNORMAL HIGH
Non-HDL Cholesterol (Calc): 116 mg/dL (calc) (ref <130)
Total CHOL/HDL Ratio: 2.3 (calc) (ref <5.0)
Triglycerides: 66 mg/dL (ref <150)

## 2021-05-28 LAB — MICROSCOPIC MESSAGE

## 2021-05-28 LAB — MICROALBUMIN / CREATININE URINE RATIO
Creatinine, Urine: 61 mg/dL (ref 20–275)
Microalb Creat Ratio: 3 mcg/mg creat (ref <30)
Microalb, Ur: 0.2 mg/dL

## 2021-05-28 LAB — VITAMIN D 25 HYDROXY (VIT D DEFICIENCY, FRACTURES): Vit D, 25-Hydroxy: 52 ng/mL (ref 30–100)

## 2021-05-28 LAB — MAGNESIUM: Magnesium: 2.1 mg/dL (ref 1.5–2.5)

## 2021-05-28 LAB — TSH: TSH: 0.89 mIU/L

## 2021-05-28 LAB — VITAMIN B12: Vitamin B-12: 363 pg/mL (ref 200–1100)

## 2021-05-28 LAB — INSULIN, RANDOM: Insulin: 9.4 u[IU]/mL

## 2021-05-28 NOTE — Progress Notes (Signed)
<><><><><><><><><><><><><><><><><><><><><><><><><><><><><><><><><> ?<><><><><><><><><><><><><><><><><><><><><><><><><><><><><><><><><> ?-   Test results slightly outside the reference range are not unusual. ?If there is anything important, I will review this with you,  ?otherwise it is considered normal test values.  ?If you have further questions,  ?please do not hesitate to contact me at the office or via My Chart.  ?<><><><><><><><><><><><><><><><><><><><><><><><><><><><><><><><><> ?<><><><><><><><><><><><><><><><><><><><><><><><><><><><><><><><><> ? ?-  Total Chol = 203  &  LDL Chol = 101  - both borderline high, So  ? ?- Recommend work a little harder on alow cholesterol diet  ? ?- Cholesterol only comes from animal sources  ?- ie. meat, dairy, egg yolks ? ?- Eat all the vegetables you want. ? ?- Avoid meat, especially red meat - Beef AND Pork . ? ?- Avoid cheese & dairy - milk & ice cream.    ? ?- Cheese is the most concentrated form of trans-fats which  ?is the worst thing to clog up our arteries.  ? ?- Veggie cheese is OK which can be found in the fresh  ?produce section at [employer] or [employer] or [employer] ?<><><><><><><><><><><><><><><><><><><><><><><><><><><><><><><><><> ?<><><><><><><><><><><><><><><><><><><><><><><><><><><><><><><><><> ? ?-  Iron level is Normal  / OK  ?<><><><><><><><><><><><><><><><><><><><><><><><><><><><><><><><><> ?<><><><><><><><><><><><><><><><><><><><><><><><><><><><><><><><><> ? ?-  But  ? ?-  Vitamin B12 =  363     is  Low  ?(Ideal or Goal Vit B12 is between 450 - 1,100)  ? ? ?Low Vit B12 may be associated with Anemia , Fatigue,  ? ?Peripheral Neuropathy, Dementia, "Brain Fog", & Depression ? ? ?- Recommend take a sub-lingual form of Vitamin B12 tablet  ? ?1,000 to 5,000 mcg tab that you dissolve under your tongue /Daily  ? ?- Can get Baron Sane - best price at [employer] or on  [employer] ?<><><><><><><><><><><><><><><><><><><><><><><><><><><><><><><><><> ?<><><><><><><><><><><><><><><><><><><><><><><><><><><><><><><><><> ? ?-  A1c - Normal - Npo  Diabetes  - Great   ! ?<><><><><><><><><><><><><><><><><><><><><><><><><><><><><><><><><> ?<><><><><><><><><><><><><><><><><><><><><><><><><><><><><><><><><> ? ?-  Vit D= 52 - sl low  ? ?- Vitamin D goal is between 70-100.  ? ?- Please make sure that you are taking your Vitamin D as directed.  ? ?( If taking 5,000 u EVERY day , then suggest increase to 2 capsules  even days of month & 1 capsule odd days of month ) ? ?- It is very important as a natural anti-inflammatory and helping the  ?immune system protect against viral infections, like the Covid-19  ? ? ?helping hair, skin, and nails, as well as reducing stroke and  ?heart attack risk.  ? ?- It helps your bones and helps with mood. ? ?- It also decreases numerous cancer risks so please  ?take it as directed.  ? ?- Low Vit D is associated with a 200-300% higher risk for  ?CANCER  ? ?and 200-300% higher risk for HEART   ATTACK  &  STROKE.   ? ?- It is also associated with higher death rate at younger ages,  ? ?autoimmune diseases like Rheumatoid arthritis, Lupus,  ?Multiple Sclerosis.    ? ?- Also many other serious conditions, like depression, Alzheimer's ? ?Dementia, infertility, muscle aches, fatigue, fibromyalgia  ? ?- just to name a few. ?<><><><><><><><><><><><><><><><><><><><><><><><><><><><><><><><><> ?<><><><><><><><><><><><><><><><><><><><><><><><><><><><><><><><><> ? ?-  All Else - CBC - Kidneys - Electrolytes - Liver - Magnesium & Thyroid   ? ?- all  Normal / OK ?<><><><><><><><><><><><><><><><><><><><><><><><><><><><><><><><><> ?<><><><><><><><><><><><><><><><><><><><><><><><><><><><><><><><><> ? ? ? ? ? ? ? ? ? ? ? ? ? ? ? ? ? ? ? ? ?

## 2021-06-17 ENCOUNTER — Other Ambulatory Visit: Payer: Self-pay

## 2021-06-17 DIAGNOSIS — Z1211 Encounter for screening for malignant neoplasm of colon: Secondary | ICD-10-CM

## 2021-06-17 LAB — POC HEMOCCULT BLD/STL (HOME/3-CARD/SCREEN)
Card #2 Fecal Occult Blod, POC: NEGATIVE
Card #3 Fecal Occult Blood, POC: NEGATIVE
Fecal Occult Blood, POC: NEGATIVE

## 2021-06-18 DIAGNOSIS — Z1211 Encounter for screening for malignant neoplasm of colon: Secondary | ICD-10-CM | POA: Diagnosis not present

## 2021-06-18 DIAGNOSIS — Z1212 Encounter for screening for malignant neoplasm of rectum: Secondary | ICD-10-CM | POA: Diagnosis not present

## 2021-10-01 LAB — HM MAMMOGRAPHY

## 2021-10-14 ENCOUNTER — Encounter: Payer: Self-pay | Admitting: Internal Medicine

## 2022-05-30 ENCOUNTER — Encounter: Payer: Self-pay | Admitting: Internal Medicine

## 2022-05-30 NOTE — Patient Instructions (Signed)
Due to recent changes in healthcare laws, you may see the results of your imaging and laboratory studies on MyChart before your provider has had a chance to review them.  We understand that in some cases there may be results that are confusing or concerning to you. Not all laboratory results come back in the same time frame and the provider may be waiting for multiple results in order to interpret others.  Please give [place] 48 hours in order for your provider to thoroughly review all the results before contacting the office for clarification of your results.   ++++++++++++++++++++++++++++++  Vit D  & Vit C 1,000 mg   are recommended to help protect  against the Covid-19 and other Corona viruses.    Also it's recommended  to take  Zinc 50 mg x 1/2 tablet = 25 mg / daily  to help  protect against the Covid-19   and best place to get  is also on [email or web address]  and don't pay more than 6-8 cents /pill !  ================================ Coronavirus (COVID-19) Are you at risk?  Are you at risk for the Coronavirus (COVID-19)?  To be considered HIGH RISK for Coronavirus (COVID-19), you have to meet the following criteria:  Traveled to [place], [place], [place], [place] or [place]; or in the [place] to [place], [place], [place]  or [place]; and have fever, cough, and shortness of breath within the last 2 weeks of travel OR Been in close contact with a person diagnosed with COVID-19 within the last 2 weeks and have  fever, cough,and shortness of breath  IF YOU DO NOT MEET THESE CRITERIA, YOU ARE CONSIDERED LOW RISK FOR COVID-19.  What to do if you are HIGH RISK for COVID-19?  If you are having a medical emergency, call 911. Seek medical care right away. Before you go to a doctor's office, urgent care or emergency department,  call ahead and tell them about your recent travel, contact with someone diagnosed with COVID-19   and your symptoms.  You should receive instructions from your  physician's office regarding next steps of care.  When you arrive at healthcare provider, tell the healthcare staff immediately you have returned from  visiting [place], [place], [place], [place] or [place]; or traveled in the [place] to [place], [place],  [place] or [place] in the last two weeks or you have been in close contact with a person diagnosed with  COVID-19 in the last 2 weeks.   Tell the health care staff about your symptoms: fever, cough and shortness of breath. After you have been seen by a medical provider, you will be either: Tested for (COVID-19) and discharged home on quarantine except to seek medical care if  symptoms worsen, and asked to  Stay home and avoid contact with others until you get your results (4-5 days)  Avoid travel on public transportation if possible (such as bus, train, or airplane) or Sent to the Emergency Department by EMS for evaluation, COVID-19 testing  and  possible admission depending on your condition and test results.  What to do if you are LOW RISK for COVID-19?  Reduce your risk of any infection by using the same precautions used for avoiding the common cold or flu:  Wash your hands often with soap and warm water for at least 20 seconds.  If soap and water are not readily available,  use an alcohol-based hand sanitizer with at least 60% alcohol.  If coughing or  sneezing, cover your mouth and nose by coughing or sneezing into the elbow areas of your shirt or coat,  into a tissue or into your sleeve (not your hands). Avoid shaking hands with others and consider head nods or verbal greetings only. Avoid touching your eyes, nose, or mouth with unwashed hands.  Avoid close contact with people who are sick. Avoid places or events with large numbers of people in one location, like concerts or sporting events. Carefully consider travel plans you have or are making. If you are planning any travel outside or inside the [place], visit the CDC's  Travelers' Health webpage for the latest health notices. If you have some symptoms but not all symptoms, continue to monitor at home and seek medical attention  if your symptoms worsen. If you are having a medical emergency, call 911. >>>>>>>>>>>>>>>>>>>>>>> Preventive Care for Adults  A healthy lifestyle and preventive care can promote health and wellness. Preventive health guidelines for women include the following key practices. A routine yearly physical is a good way to check with your health care provider about your health and preventive screening. It is a chance to share any concerns and updates on your health and to receive a thorough exam. Visit your dentist for a routine exam and preventive care every 6 months. Brush your teeth twice a day and floss once a day. Good oral hygiene prevents tooth decay and gum disease. The frequency of eye exams is based on your age, health, family medical history, use of contact lenses, and other factors. Follow your health care provider's recommendations for frequency of eye exams. Eat a healthy diet. Foods like vegetables, fruits, whole grains, low-fat dairy products, and lean protein foods contain the nutrients you need without too many calories. Decrease your intake of foods high in solid fats, added sugars, and salt. Eat the right amount of calories for you. Get information about a proper diet from your health care provider, if necessary. Regular physical exercise is one of the most important things you can do for your health. Most adults should get at least 150 minutes of moderate-intensity exercise (any activity that increases your heart rate and causes you to sweat) each week. In addition, most adults need muscle-strengthening exercises on 2 or more days a week. Maintain a healthy weight. The body mass index (BMI) is a screening tool to identify possible weight problems. It provides an estimate of body fat based on height and weight. Your health care  provider can find your BMI and can help you achieve or maintain a healthy weight. For adults 20 years and older: A BMI below 18.5 is considered underweight. A BMI of 18.5 to 24.9 is normal. A BMI of 25 to 29.9 is considered overweight. A BMI of 30 and above is considered obese. Maintain normal blood lipids and cholesterol levels by exercising and minimizing your intake of saturated fat. Eat a balanced diet with plenty of fruit and vegetables. Blood tests for lipids and cholesterol should begin at age 23 and be repeated every 5 years. If your lipid or cholesterol levels are high, you are over 50, or you are at high risk for heart disease, you may need your cholesterol levels checked more frequently. Ongoing high lipid and cholesterol levels should be treated with medicines if diet and exercise are not working. If you smoke, find out from your health care provider how to quit. If you do not use tobacco, do not start. Lung cancer screening is recommended for adults aged 14-80  years who are at high risk for developing lung cancer because of a history of smoking. A yearly low-dose CT scan of the lungs is recommended for people who have at least a 30-pack-year history of smoking and are a current smoker or have quit within the past 15 years. A pack year of smoking is smoking an average of 1 pack of cigarettes a day for 1 year (for example: 1 pack a day for 30 years or 2 packs a day for 15 years). Yearly screening should continue until the smoker has stopped smoking for at least 15 years. Yearly screening should be stopped for people who develop a health problem that would prevent them from having lung cancer treatment. High blood pressure causes heart disease and increases the risk of stroke. Your blood pressure should be checked at least every 1 to 2 years. Ongoing high blood pressure should be treated with medicines if weight loss and exercise do not work. If you are 46-64 years old, ask your health care  provider if you should take aspirin to prevent strokes. Diabetes screening involves taking a blood sample to check your fasting blood sugar level. This should be done once every 3 years, after age 20, if you are within normal weight and without risk factors for diabetes. Testing should be considered at a younger age or be carried out more frequently if you are overweight and have at least 1 risk factor for diabetes. Breast cancer screening is essential preventive care for women. You should practice "breast self-awareness." This means understanding the normal appearance and feel of your breasts and may include breast self-examination. Any changes detected, no matter how small, should be reported to a health care provider. Women in their 54s and 30s should have a clinical breast exam (CBE) by a health care provider as part of a regular health exam every 1 to 3 years. After age 37, women should have a CBE every year. Starting at age 42, women should consider having a mammogram (breast X-ray test) every year. Women who have a family history of breast cancer should talk to their health care provider about genetic screening. Women at a high risk of breast cancer should talk to their health care providers about having an MRI and a mammogram every year. Breast cancer gene (BRCA)-related cancer risk assessment is recommended for women who have family members with BRCA-related cancers. BRCA-related cancers include breast, ovarian, tubal, and peritoneal cancers. Having family members with these cancers may be associated with an increased risk for harmful changes (mutations) in the breast cancer genes BRCA1 and BRCA2. Results of the assessment will determine the need for genetic counseling and BRCA1 and BRCA2 testing. Routine pelvic exams to screen for cancer are no longer recommended for nonpregnant women who are considered low risk for cancer of the pelvic organs (ovaries, uterus, and vagina) and who do not have symptoms.  Ask your health care provider if a screening pelvic exam is right for you. If you have had past treatment for cervical cancer or a condition that could lead to cancer, you need Pap tests and screening for cancer for at least 20 years after your treatment. If Pap tests have been discontinued, your risk factors (such as having a new sexual partner) need to be reassessed to determine if screening should be resumed. Some women have medical problems that increase the chance of getting cervical cancer. In these cases, your health care provider may recommend more frequent screening and Pap tests. Colorectal cancer can  be detected and often prevented. Most routine colorectal cancer screening begins at the age of 74 years and continues through age 70 years. However, your health care provider may recommend screening at an earlier age if you have risk factors for colon cancer. On a yearly basis, your health care provider may provide home test kits to check for hidden blood in the stool. Use of a small camera at the end of a tube, to directly examine the colon (sigmoidoscopy or colonoscopy), can detect the earliest forms of colorectal cancer. Talk to your health care provider about this at age 26, when routine screening begins.  Direct exam of the colon should be repeated every 5-10 years through age 47 years, unless early forms of pre-cancerous polyps or small growths are found. Hepatitis C blood testing is recommended for all people born from [birth year] through 1965 and any individual with known risks for hepatitis C.  Osteoporosis is a disease in which the bones lose minerals and strength with aging. This can result in serious bone fractures or breaks. The risk of osteoporosis can be identified using a bone density scan. Women ages 51 years and over and women at risk for fractures or osteoporosis should discuss screening with their health care providers. Ask your health care provider whether you should take a calcium  supplement or vitamin D to reduce the rate of osteoporosis. Menopause can be associated with physical symptoms and risks. Hormone replacement therapy is available to decrease symptoms and risks. You should talk to your health care provider about whether hormone replacement therapy is right for you. Use sunscreen. Apply sunscreen liberally and repeatedly throughout the day. You should seek shade when your shadow is shorter than you. Protect yourself by wearing long sleeves, pants, a wide-brimmed hat, and sunglasses year round, whenever you are outdoors. Once a month, do a whole body skin exam, using a mirror to look at the skin on your back. Tell your health care provider of new moles, moles that have irregular borders, moles that are larger than a pencil eraser, or moles that have changed in shape or color. Stay current with required vaccines (immunizations). Influenza vaccine. All adults should be immunized every year. Tetanus, diphtheria, and acellular pertussis (Td, Tdap) vaccine. Pregnant women should receive 1 dose of Tdap vaccine during each pregnancy. The dose should be obtained regardless of the length of time since the last dose. Immunization is preferred during the 27th-36th week of gestation. An adult who has not previously received Tdap or who does not know her vaccine status should receive 1 dose of Tdap. This initial dose should be followed by tetanus and diphtheria toxoids (Td) booster doses every 10 years. Adults with an unknown or incomplete history of completing a 3-dose immunization series with Td-containing vaccines should begin or complete a primary immunization series including a Tdap dose. Adults should receive a Td booster every 10 years. Varicella vaccine. An adult without evidence of immunity to varicella should receive 2 doses or a second dose if she has previously received 1 dose. Pregnant females who do not have evidence of immunity should receive the first dose after pregnancy.  This first dose should be obtained before leaving the health care facility. The second dose should be obtained 4-8 weeks after the first dose. Human papillomavirus (HPV) vaccine. Females aged 13-26 years who have not received the vaccine previously should obtain the 3-dose series. The vaccine is not recommended for use in pregnant females. However, pregnancy testing is not needed before receiving  a dose. If a female is found to be pregnant after receiving a dose, no treatment is needed. In that case, the remaining doses should be delayed until after the pregnancy. Immunization is recommended for any person with an immunocompromised condition through the age of 107 years if she did not get any or all doses earlier. During the 3-dose series, the second dose should be obtained 4-8 weeks after the first dose. The third dose should be obtained 24 weeks after the first dose and 16 weeks after the second dose. Zoster vaccine. One dose is recommended for adults aged 18 years or older unless certain conditions are present. Measles, mumps, and rubella (MMR) vaccine. Adults born before [birth year] generally are considered immune to measles and mumps. Adults born in 1 or later should have 1 or more doses of MMR vaccine unless there is a contraindication to the vaccine or there is laboratory evidence of immunity to each of the three diseases. A routine second dose of MMR vaccine should be obtained at least 28 days after the first dose for students attending postsecondary schools, health care workers, or international travelers. People who received inactivated measles vaccine or an unknown type of measles vaccine during 1963-1967 should receive 2 doses of MMR vaccine. People who received inactivated mumps vaccine or an unknown type of mumps vaccine before 1979 and are at high risk for mumps infection should consider immunization with 2 doses of MMR vaccine. For females of childbearing age, rubella immunity should be determined. If  there is no evidence of immunity, females who are not pregnant should be vaccinated. If there is no evidence of immunity, females who are pregnant should delay immunization until after pregnancy. Unvaccinated health care workers born before 25 who lack laboratory evidence of measles, mumps, or rubella immunity or laboratory confirmation of disease should consider measles and mumps immunization with 2 doses of MMR vaccine or rubella immunization with 1 dose of MMR vaccine. Pneumococcal 13-valent conjugate (PCV13) vaccine. When indicated, a person who is uncertain of her immunization history and has no record of immunization should receive the PCV13 vaccine. An adult aged 42 years or older who has certain medical conditions and has not been previously immunized should receive 1 dose of PCV13 vaccine. This PCV13 should be followed with a dose of pneumococcal polysaccharide (PPSV23) vaccine. The PPSV23 vaccine dose should be obtained at least 1 or more year(s) after the dose of PCV13 vaccine. An adult aged 88 years or older who has certain medical conditions and previously received 1 or more doses of PPSV23 vaccine should receive 1 dose of PCV13. The PCV13 vaccine dose should be obtained 1 or more years after the last PPSV23 vaccine dose.  Pneumococcal polysaccharide (PPSV23) vaccine. When PCV13 is also indicated, PCV13 should be obtained first. All adults aged 68 years and older should be immunized. An adult younger than age 71 years who has certain medical conditions should be immunized. Any person who resides in a nursing home or long-term care facility should be immunized. An adult smoker should be immunized. People with an immunocompromised condition and certain other conditions should receive both PCV13 and PPSV23 vaccines. People with human immunodeficiency virus (HIV) infection should be immunized as soon as possible after diagnosis. Immunization during chemotherapy or radiation therapy should be avoided.  Routine use of PPSV23 vaccine is not recommended for American Indians, [place] Natives, or people younger than 65 years unless there are medical conditions that require PPSV23 vaccine. When indicated, people who have unknown immunization  and have no record of immunization should receive PPSV23 vaccine. One-time revaccination 5 years after the first dose of PPSV23 is recommended for people aged 19-64 years who have chronic kidney failure, nephrotic syndrome, asplenia, or immunocompromised conditions. People who received 1-2 doses of PPSV23 before age 56 years should receive another dose of PPSV23 vaccine at age 75 years or later if at least 5 years have passed since the previous dose. Doses of PPSV23 are not needed for people immunized with PPSV23 at or after age 63 years.  Preventive Services / Frequency  Ages 80 to 4 years Blood pressure check. Lipid and cholesterol check. Lung cancer screening. / Every year if you are aged 36-80 years and have a 30-pack-year history of smoking and currently smoke or have quit within the past 15 years. Yearly screening is stopped once you have quit smoking for at least 15 years or develop a health problem that would prevent you from having lung cancer treatment. Clinical breast exam.** / Every year after age 99 years.  BRCA-related cancer risk assessment.** / For women who have family members with a BRCA-related cancer (breast, ovarian, tubal, or peritoneal cancers). Mammogram.** / Every year beginning at age 68 years and continuing for as long as you are in good health. Consult with your health care provider. Pap test.** / Every 3 years starting at age 57 years through age 53 or 61 years with a history of 3 consecutive normal Pap tests. HPV screening.** / Every 3 years from ages 32 years through ages 59 to 25 years with a history of 3 consecutive normal Pap tests. Fecal occult blood test (FOBT) of stool. / Every year beginning at age 72 years and continuing until age  32 years. You may not need to do this test if you get a colonoscopy every 10 years. Flexible sigmoidoscopy or colonoscopy.** / Every 5 years for a flexible sigmoidoscopy or every 10 years for a colonoscopy beginning at age 54 years and continuing until age 59 years. Hepatitis C blood test.** / For all people born from [birth year] through 1966 and any individual with known risks for hepatitis C. Skin self-exam. / Monthly. Influenza vaccine. / Every year. Tetanus, diphtheria, and acellular pertussis (Tdap/Td) vaccine.** / Consult your health care provider. Pregnant women should receive 1 dose of Tdap vaccine during each pregnancy. 1 dose of Td every 10 years. Varicella vaccine.** / Consult your health care provider. Pregnant females who do not have evidence of immunity should receive the first dose after pregnancy. Zoster vaccine.** / 1 dose for adults aged 33 years or older. Pneumococcal 13-valent conjugate (PCV13) vaccine.** / Consult your health care provider. Pneumococcal polysaccharide (PPSV23) vaccine.** / 1 to 2 doses if you smoke cigarettes or if you have certain conditions. Meningococcal vaccine.** / Consult your health care provider. Hepatitis A vaccine.** / Consult your health care provider. Hepatitis B vaccine.** / Consult your health care provider. Screening for abdominal aortic aneurysm (AAA)  by ultrasound is recommended for people over 50 who have history of high blood pressure or who are current or former smokers. ++++++++++++++++++ Recommend Adult Low Dose Aspirin or  coated  Aspirin 81 mg daily  To reduce risk of Colon Cancer 40 %,  Skin Cancer 26 % ,  Melanoma 46%  and  Pancreatic cancer 60% +++++++++++++++++++ Vitamin D goal  is between 70-100.  Please make sure that you are taking your Vitamin D as directed.  It is very important as a natural anti-inflammatory  helping hair, skin, and  nails, as well as reducing stroke and heart attack risk.  It helps your bones and helps with  mood. It also decreases numerous cancer risks so please take it as directed.  Low Vit D is associated with a 200-300% higher risk for CANCER  and 200-300% higher risk for HEART   ATTACK  &  STROKE.   .....................................Marland Kitchen It is also associated with higher death rate at younger ages,  autoimmune diseases like Rheumatoid arthritis, Lupus, Multiple Sclerosis.    Also many other serious conditions, like depression, Alzheimer's Dementia, infertility, muscle aches, fatigue, fibromyalgia - just to name a few. ++++++++++++++++++ Recommend the book "The END of DIETING" by Dr Excell Seltzer  & the book "The END of DIABETES " by Dr Excell Seltzer At [email or web address] - get book & Audio CD's    Being diabetic has a  300% increased risk for heart attack, stroke, cancer, and alzheimer- type vascular dementia. It is very important that you work harder with diet by avoiding all foods that are white. Avoid white rice (brown & wild rice is OK), white potatoes (sweetpotatoes in moderation is OK), White bread or wheat bread or anything made out of white flour like bagels, donuts, rolls, buns, biscuits, cakes, pastries, cookies, pizza crust, and pasta (made from white flour & egg whites) - vegetarian pasta or spinach or wheat pasta is OK. Multigrain breads like Arnold's or [employer], or multigrain sandwich thins or flatbreads.  Diet, exercise and weight loss can reverse and cure diabetes in the early stages.  Diet, exercise and weight loss is very important in the control and prevention of complications of diabetes which affects every system in your body, ie. Brain - dementia/stroke, eyes - glaucoma/blindness, heart - heart attack/heart failure, kidneys - dialysis, stomach - gastric paralysis, intestines - malabsorption, nerves - severe painful neuritis, circulation - gangrene & loss of a leg(s), and finally cancer and Alzheimers.    I recommend avoid fried & greasy foods,  sweets/candy, white rice (brown or  wild rice or Quinoa is OK), white potatoes (sweet potatoes are OK) - anything made from white flour - bagels, doughnuts, rolls, buns, biscuits,white and wheat breads, pizza crust and traditional pasta made of white flour & egg white(vegetarian pasta or spinach or wheat pasta is OK).  Multi-grain bread is OK - like multi-grain flat bread or sandwich thins. Avoid alcohol in excess. Exercise is also important.    Eat all the vegetables you want - avoid meat, especially red meat and dairy - especially cheese.  Cheese is the most concentrated form of trans-fats which is the worst thing to clog up our arteries. Veggie cheese is OK which can be found in the fresh produce section at [employer] or [employer] or [employer]  ++++++++++++++++++++++ DASH Eating Plan  DASH stands for "Dietary Approaches to Stop Hypertension."   The DASH eating plan is a healthy eating plan that has been shown to reduce high blood pressure (hypertension). Additional health benefits may include reducing the risk of type 2 diabetes mellitus, heart disease, and stroke. The DASH eating plan may also help with weight loss. WHAT DO I NEED TO KNOW ABOUT THE DASH EATING PLAN? For the DASH eating plan, you will follow these general guidelines: Choose foods with a percent daily value for sodium of less than 5% (as listed on the food label). Use salt-free seasonings or herbs instead of table salt or sea salt. Check with your health care provider or pharmacist before using salt substitutes. Eat lower-sodium  products, often labeled as "lower sodium" or "no salt added." Eat fresh foods. Eat more vegetables, fruits, and low-fat dairy products. Choose whole grains. Look for the word "whole" as the first word in the ingredient list. Choose fish  Limit sweets, desserts, sugars, and sugary drinks. Choose heart-healthy fats. Eat veggie cheese  Eat more home-cooked food and less restaurant, buffet, and fast food. Limit fried foods. Cook  foods using methods other than frying. Limit canned vegetables. If you do use them, rinse them well to decrease the sodium. When eating at a restaurant, ask that your food be prepared with less salt, or no salt if possible.                      WHAT FOODS CAN I EAT? Read Dr Fara Olden Fuhrman's books on The End of Dieting & The End of Diabetes  Grains Whole grain or whole wheat bread. Brown rice. Whole grain or whole wheat pasta. Quinoa, bulgur, and whole grain cereals. Low-sodium cereals. Corn or whole wheat flour tortillas. Whole grain cornbread. Whole grain crackers. Low-sodium crackers.  Vegetables Fresh or frozen vegetables (raw, steamed, roasted, or grilled). Low-sodium or reduced-sodium tomato and vegetable juices. Low-sodium or reduced-sodium tomato sauce and paste. Low-sodium or reduced-sodium canned vegetables.   Fruits All fresh, canned (in natural juice), or frozen fruits.  Protein Products  All fish and seafood.  Dried beans, peas, or lentils. Unsalted nuts and seeds. Unsalted canned beans.  Dairy Low-fat dairy products, such as skim or 1% milk, 2% or reduced-fat cheeses, low-fat ricotta or cottage cheese, or plain low-fat yogurt. Low-sodium or reduced-sodium cheeses.  Fats and Oils Tub margarines without trans fats. Light or reduced-fat mayonnaise and salad dressings (reduced sodium). Avocado. Safflower, olive, or canola oils. Natural peanut or almond butter.  Other Unsalted popcorn and pretzels. The items listed above may not be a complete list of recommended foods or beverages. Contact your dietitian for more options.  ++++++++++++++++++  WHAT FOODS ARE NOT RECOMMENDED? Grains/ White flour or wheat flour White bread. White pasta. White rice. Refined cornbread. Bagels and croissants. Crackers that contain trans fat.  Vegetables  Creamed or fried vegetables. Vegetables in a . Regular canned vegetables. Regular canned tomato sauce and paste. Regular tomato and vegetable  juices.  Fruits Dried fruits. Canned fruit in light or heavy syrup. Fruit juice.  Meat and Other Protein Products Meat in general - RED meat & White meat.  Fatty cuts of meat. Ribs, chicken wings, all processed meats as bacon, sausage, bologna, salami, fatback, hot dogs, bratwurst and packaged luncheon meats.  Dairy Whole or 2% milk, cream, half-and-half, and cream cheese. Whole-fat or sweetened yogurt. Full-fat cheeses or blue cheese. Non-dairy creamers and whipped toppings. Processed cheese, cheese spreads, or cheese curds.  Condiments Onion and garlic salt, seasoned salt, table salt, and sea salt. Canned and packaged gravies. Worcestershire sauce. Tartar sauce. Barbecue sauce. Teriyaki sauce. Soy sauce, including reduced sodium. Steak sauce. Fish sauce. Oyster sauce. Cocktail sauce. Horseradish. Ketchup and mustard. Meat flavorings and tenderizers. Bouillon cubes. Hot sauce. Tabasco sauce. Marinades. Taco seasonings. Relishes.  Fats and Oils Butter, stick margarine, lard, shortening and bacon fat. Coconut, palm kernel, or palm oils. Regular salad dressings.  Pickles and olives. Salted popcorn and pretzels.  The items listed above may not be a complete list of foods and beverages to avoid.

## 2022-05-30 NOTE — Progress Notes (Unsigned)
Annual Screening/Preventative Visit & Comprehensive Evaluation &  Examination   Future Appointments  Date Time Provider Department  06/01/2022 10:00 AM Unk Pinto, MD [facility]  06/03/2023 10:00 AM Unk Pinto, MD [facility]         This very nice 47 [name]  MWF presents for a Screening /Preventative Visit & comprehensive evaluation and management of multiple medical co-morbidities.  Patient has been followed for labile HTN, HLD, abnormal Glucose  and Vitamin D Deficiency. Patient also has hx/o migraines. Patient also has GERD controlled  with her meds.        Patient is monitored expectantly for Labile HTN.  In 2021, patient had a normal stress Cardiac Echo. Patient's BP has been controlled at home and patient denies any cardiac symptoms as chest pain, palpitations, shortness of breath, dizziness or ankle swelling. Today's BP is  rechecked at goal - 100/70 .        Patient's hyperlipidemia is controlled with diet . Last lipids were at goal :  Lab Results  Component Value Date   CHOL 203 (H) 05/27/2021   HDL 87 05/27/2021   LDLCALC 101 (H) 05/27/2021   TRIG 66 05/27/2021   CHOLHDL 2.3 05/27/2021         Patient is monitored expectantly for glucose intolerance  and patient denies reactive hypoglycemic symptoms, visual blurring, diabetic polys or paresthesias. Last A1c was normal & at goal :  Lab Results  Component Value Date   HGBA1C 5.0 05/27/2021         Patient had a low Vitamin B12 of "363" last year (2023) .     Also, patient has history of Vitamin D Deficiency ("42" on tx /2016) and last Vitamin D was  low at "52"  (goal  70-100)   Last vitamin D Lab Results  Component Value Date   VD25OH 52 05/27/2021     Current Outpatient Medications on File Prior to Visit  Medication Sig   FIORICET  Take 1 to 2 tablets every 4 to 6 hours for Migraine   VITAMIN D   5,000 Units  Take daily   VITAMIN B12 SL Place 1 tablet under the tongue daily.   famotidine  20  MG tablet OTC Take  as needed for heartburn    Fexofenadine 180 MG tablet Take daily.    MAGNESIUM  Take 1 tablet daily.   MultiVit-Minerals  Take daily.   Omega-3 FISH OIL Take daily.   Tumeric  1 daily     Allergies  Allergen Reactions   Sulfa Antibiotics    Levaquin [Levofloxacin In D5w] Rash     Past Medical History:  Diagnosis Date   Allergy    GERD (gastroesophageal reflux disease)    Hx of adenomatous colonic sessile serrated polyps 10/02/2020   4 adenomas and 1 ssp   Hx of migraines    "always over left eye"   Parvovirus infection 2013   Shingles 2011   UTI (lower urinary tract infection)    Vitamin D deficiency      Health Maintenance  Topic Date Due   PAP SMEAR-Modifier  Never done   INFLUENZA VACCINE  10/20/2020   TETANUS/TDAP  06/22/2026   Hepatitis C Screening  Completed   HIV Screening  Completed   HPV VACCINES  Aged Out     Immunization History  Administered Date(s) Administered   Influenza Whole 12/30/2011   Influenza 02/05/2015   Influenza 01/21/2014   PPD Test 04/28/2018, 05/08/2019, 05/20/2020  Pneumococcal -13 12/30/2011   Td 01/29/2006   Tdap 06/21/2016    Last Colon - 09/19/2020 - Dr Carlean Purl - 5 polyps - Recc 3 year  f/u - due July 2025   Last MGM - 10/01/2021   Past Surgical History:  Procedure Laterality Date   ESOPHAGOGASTRODUODENOSCOPY ENDOSCOPY  03/22/2010   WISDOM TOOTH EXTRACTION       Family History  Problem Relation Age of Onset   Mitral valve prolapse Mother    Throat cancer Paternal Uncle    Cancer Maternal Grandmother        breast   Cancer Maternal Grandfather        lung   Stomach cancer Paternal Grandmother    Cancer Paternal Grandfather        prostate   Colon cancer Neg Hx    Esophageal cancer Neg Hx    Colon polyps Neg Hx      Social History   Tobacco Use   Smoking status: Never   Smokeless tobacco: Never  Vaping Use   Vaping Use: Never used  Substance Use Topics   Alcohol use: Yes     Alcohol/week: 1.0 standard drink    Types: 1 Glasses of wine per week   Drug use: No      ROS Constitutional: Denies fever, chills, weight loss/gain, headaches, insomnia,  night sweats, and change in appetite. Does c/o fatigue. Eyes: Denies redness, blurred vision, diplopia, discharge, itchy, watery eyes.  ENT: Denies discharge, congestion, post nasal drip, epistaxis, sore throat, earache, hearing loss, dental pain, Tinnitus, Vertigo, Sinus pain, snoring.  Cardio: Denies chest pain, palpitations, irregular heartbeat, syncope, dyspnea, diaphoresis, orthopnea, PND, claudication, edema Respiratory: denies cough, dyspnea, DOE, pleurisy, hoarseness, laryngitis, wheezing.  Gastrointestinal: Denies dysphagia, heartburn, reflux, water brash, pain, cramps, nausea, vomiting, bloating, diarrhea, constipation, hematemesis, melena, hematochezia, jaundice, hemorrhoids Genitourinary: Denies dysuria, frequency, urgency, nocturia, hesitancy, discharge, hematuria, flank pain Breast: Breast lumps, nipple discharge, bleeding.  Musculoskeletal: Denies arthralgia, myalgia, stiffness, Jt. Swelling, pain, limp, and strain/sprain. Denies falls. Skin: Denies puritis, rash, hives, warts, acne, eczema, changing in skin lesion Neuro: No weakness, tremor, incoordination, spasms, paresthesia, pain Psychiatric: Denies confusion, memory loss, sensory loss. Denies Depression. Endocrine: Denies change in weight, skin, hair change, nocturia, and paresthesia, diabetic polys, visual blurring, hyper / hypo glycemic episodes.  Heme/Lymph: No excessive bleeding, bruising, enlarged lymph nodes.  Physical Exam  BP 110/68   Pulse 68   Temp 97.9 F (36.6 C)   Resp 16   Ht '5\' 6"'$  (1.676 m)   Wt 127 lb 12.8 oz (Danielle kg)   SpO2 98%   BMI 20.63 kg/m   General Appearance: Well nourished, well groomed and in no apparent distress.  Eyes: PERRLA, EOMs, conjunctiva no swelling or erythema, normal fundi and vessels. Sinuses: No  frontal/maxillary tenderness ENT/Mouth: EACs patent / TMs  nl. Nares clear without erythema, swelling, mucoid exudates. Oral hygiene is good. No erythema, swelling, or exudate. Tongue normal, non-obstructing. Tonsils not swollen or erythematous. Hearing normal.  Neck: Supple, thyroid not palpable. No bruits, nodes or JVD. Respiratory: Respiratory effort normal.  BS equal and clear bilateral without rales, rhonci, wheezing or stridor. Cardio: Heart sounds are normal with regular rate and rhythm and no murmurs, rubs or gallops. Peripheral pulses are normal and equal bilaterally without edema. No aortic or femoral bruits. Chest: symmetric with normal excursions and percussion. Abdomen: Flat, soft with bowel sounds active. Nontender, no guarding, rebound, hernias, masses, or organomegaly.  Lymphatics: Non tender without lymphadenopathy.  Musculoskeletal: Full ROM all peripheral extremities, joint stability, 5/5 strength, and normal gait. Skin: Warm and dry without rashes, lesions, cyanosis, clubbing or  ecchymosis.  Neuro: Cranial nerves intact, reflexes equal bilaterally. Normal muscle tone, no cerebellar symptoms. Sensation intact.  Pysch: Alert and oriented X 3, normal affect, Insight and Judgment appropriate.    Assessment and Plan  1. Annual Preventative Screening Examination   2. Elevated BP without diagnosis of hypertension  - EKG 12-Lead - [place], RETROPERITNL ABD,  LTD - Urinalysis, Routine w reflex microscopic - Microalbumin / creatinine urine ratio - CBC with Differential/Platelet - COMPLETE METABOLIC PANEL WITH GFR - Magnesium - TSH  3. Hyperlipidemia, mixed  - EKG 12-Lead - [place], RETROPERITNL ABD,  LTD - Lipid panel - TSH  4. Abnormal glucose  - EKG 12-Lead - [place], RETROPERITNL ABD,  LTD - Hemoglobin A1c - Insulin, random  5. Vitamin D deficiency  - VITAMIN D 25 Hydroxy  6. Migraine without aura and without status migrainosus, not intractable   7. Vitamin B12  deficiency  - Vitamin B12 - Methylmalonic acid, serum  8. Gastroesophageal reflux disease without esophagitis   9. Screening for colorectal cancer  - POC Hemoccult Bld/Stl (3-Cd Home Screen); Future  10. Screening-pulmonary TB  - TB Skin Test  11. FHx: heart disease  - EKG 12-Lead  12. Screening for AAA (aortic abdominal aneurysm)  - [place], RETROPERITNL ABD,  LTD  13. Fatigue  - [place], RETROPERITNL ABD,  LTD - Iron, Total/Total Iron Binding Cap - Vitamin B12 - CBC with Differential/Platelet - TSH  14. Medication management  - Urinalysis, Routine w reflex microscopic - Microalbumin / creatinine urine ratio - CBC with Differential/Platelet - COMPLETE METABOLIC PANEL WITH GFR - Magnesium - Lipid panel - TSH - Hemoglobin A1c - Insulin, random - VITAMIN D 25 Hydroxy - Methylmalonic acid, serum         Patient was counseled in prudent diet to achieve/maintain BMI less than 25 for weight control, BP monitoring, regular exercise and medications. Discussed med's effects and SE's. Screening labs and tests as requested with regular follow-up as recommended. Over 40 minutes of exam, counseling, chart review and high complex critical decision making was performed.   Kirtland Bouchard, MD

## 2022-05-31 ENCOUNTER — Encounter: Payer: Self-pay | Admitting: Internal Medicine

## 2022-06-01 ENCOUNTER — Ambulatory Visit (INDEPENDENT_AMBULATORY_CARE_PROVIDER_SITE_OTHER): Payer: BC Managed Care – PPO | Admitting: Internal Medicine

## 2022-06-01 ENCOUNTER — Encounter: Payer: Self-pay | Admitting: Internal Medicine

## 2022-06-01 VITALS — BP 100/70 | HR 62 | Temp 97.9°F | Resp 16 | Ht 66.0 in | Wt 130.0 lb

## 2022-06-01 DIAGNOSIS — Z0001 Encounter for general adult medical examination with abnormal findings: Secondary | ICD-10-CM

## 2022-06-01 DIAGNOSIS — E538 Deficiency of other specified B group vitamins: Secondary | ICD-10-CM

## 2022-06-01 DIAGNOSIS — E559 Vitamin D deficiency, unspecified: Secondary | ICD-10-CM

## 2022-06-01 DIAGNOSIS — R030 Elevated blood-pressure reading, without diagnosis of hypertension: Secondary | ICD-10-CM | POA: Diagnosis not present

## 2022-06-01 DIAGNOSIS — Z130 Encounter for screening for diseases of the blood and blood-forming organs and certain disorders involving the immune mechanism: Secondary | ICD-10-CM | POA: Diagnosis not present

## 2022-06-01 DIAGNOSIS — R5383 Other fatigue: Secondary | ICD-10-CM

## 2022-06-01 DIAGNOSIS — Z0000 Encounter for general adult medical examination without abnormal findings: Secondary | ICD-10-CM | POA: Diagnosis not present

## 2022-06-01 DIAGNOSIS — Z111 Encounter for screening for respiratory tuberculosis: Secondary | ICD-10-CM | POA: Diagnosis not present

## 2022-06-01 DIAGNOSIS — R7309 Other abnormal glucose: Secondary | ICD-10-CM

## 2022-06-01 DIAGNOSIS — Z13220 Encounter for screening for lipoid disorders: Secondary | ICD-10-CM

## 2022-06-01 DIAGNOSIS — E782 Mixed hyperlipidemia: Secondary | ICD-10-CM

## 2022-06-01 DIAGNOSIS — Z8249 Family history of ischemic heart disease and other diseases of the circulatory system: Secondary | ICD-10-CM

## 2022-06-01 DIAGNOSIS — K219 Gastro-esophageal reflux disease without esophagitis: Secondary | ICD-10-CM

## 2022-06-01 DIAGNOSIS — Z1389 Encounter for screening for other disorder: Secondary | ICD-10-CM | POA: Diagnosis not present

## 2022-06-01 DIAGNOSIS — Z79899 Other long term (current) drug therapy: Secondary | ICD-10-CM | POA: Diagnosis not present

## 2022-06-01 DIAGNOSIS — Z131 Encounter for screening for diabetes mellitus: Secondary | ICD-10-CM

## 2022-06-01 DIAGNOSIS — Z1211 Encounter for screening for malignant neoplasm of colon: Secondary | ICD-10-CM

## 2022-06-01 DIAGNOSIS — G43009 Migraine without aura, not intractable, without status migrainosus: Secondary | ICD-10-CM

## 2022-06-01 DIAGNOSIS — Z136 Encounter for screening for cardiovascular disorders: Secondary | ICD-10-CM | POA: Diagnosis not present

## 2022-06-01 MED ORDER — BUTALBITAL-APAP-CAFFEINE 50-325-40 MG PO TABS: ORAL_TABLET | ORAL | 1 refills | Status: AC

## 2022-06-02 NOTE — Progress Notes (Signed)
<><><><><><><><><><><><><><><><><><><><><><><><><><><><><><><><><> <><><><><><><><><><><><><><><><><><><><><><><><><><><><><><><><><> -   Test results slightly outside the reference range are not unusual. If there is anything important, I will review this with you,  otherwise it is considered normal test values.  If you have further questions,  please do not hesitate to contact me at the office or via My Chart.  <><><><><><><><><><><><><><><><><><><><><><><><><><><><><><><><><> <><><><><><><><><><><><><><><><><><><><><><><><><><><><><><><><><>  -  Iron studies  -  Normal  <><><><><><><><><><><><><><><><><><><><><><><><><><><><><><><><><>  -  Vitamin B12 =  253  -    Very Low  (Ideal or Goal Vit B12 is between 450 - 1,100)   - Please Restart  your Vit B12  Low Vit B12 may be associated with Anemia , Fatigue,   Peripheral Neuropathy, Dementia, "Brain Fog", & Depression  - Recommend take a sub-lingual form of Vitamin B12 tablet   1,000 to 5,000 mcg tab that you dissolve under your tongue /Daily   - Can get Baron Sane - best price at [employer] or on [employer] <><><><><><><><><><><><><><><><><><><><><><><><><><><><><><><><><>  -  Total Chol = 184   &   LDL Chol = 91   - Both  Excellent   - Very low risk for Heart Attack  / Stroke <><><><><><><><><><><><><><><><><><><><><><><><><><><><><><><><><>  -   A1c = 5.3% - No Diabetes  - Great ! <><><><><><><><><><><><><><><><><><><><><><><><><><><><><><><><><>  -  Vitamin D = 68 - Excellent  - Please keep dosing same <><><><><><><><><><><><><><><><><><><><><><><><><><><><><><><><><>  -  All Else - CBC - Kidneys - Electrolytes - Liver - Magnesium & Thyroid    - all  Normal / OK <><><><><><><><><><><><><><><><><><><><><><><><><><><><><><><><><> <><><><><><><><><><><><><><><><><><><><><><><><><><><><><><><><><>                            Very Low  (Ideal or Goal Vit B12 is between 450 - 1,100)   Low Vit B12 may be  associated with Anemia , Fatigue,   Peripheral Neuropathy, Dementia, "Brain Fog", & Depression  - Recommend take a sub-lingual form of Vitamin B12 tablet   1,000 to 5,000 mcg tab that you dissolve under your tongue /Daily   - Can get Baron Sane - best price at [employer] or on [employer]

## 2022-06-05 LAB — LIPID PANEL
Cholesterol: 184 mg/dL (ref <200)
HDL: 78 mg/dL (ref >=50)
LDL Cholesterol (Calc): 91 mg/dL (calc)
Non-HDL Cholesterol (Calc): 106 mg/dL (calc) (ref <130)
Total CHOL/HDL Ratio: 2.4 (calc) (ref <5.0)
Triglycerides: 60 mg/dL (ref <150)

## 2022-06-05 LAB — VITAMIN D 25 HYDROXY (VIT D DEFICIENCY, FRACTURES): Vit D, 25-Hydroxy: 68 ng/mL (ref 30–100)

## 2022-06-05 LAB — URINALYSIS, ROUTINE W REFLEX MICROSCOPIC
Bacteria, UA: NONE SEEN /HPF
Bilirubin Urine: NEGATIVE
Glucose, UA: NEGATIVE
Hyaline Cast: NONE SEEN /LPF
Ketones, ur: NEGATIVE
Leukocytes,Ua: NEGATIVE
Nitrite: NEGATIVE
Protein, ur: NEGATIVE
RBC / HPF: NONE SEEN /HPF (ref 0–2)
Specific Gravity, Urine: 1.005 (ref 1.001–1.035)
WBC, UA: NONE SEEN /HPF (ref 0–5)
pH: 7 (ref 5.0–8.0)

## 2022-06-05 LAB — COMPLETE METABOLIC PANEL WITH GFR
AG Ratio: 1.8 (calc) (ref 1.0–2.5)
ALT: 8 U/L (ref 6–29)
AST: 13 U/L (ref 10–35)
Albumin: 4.2 g/dL (ref 3.6–5.1)
Alkaline phosphatase (APISO): 39 U/L (ref 31–125)
BUN: 15 mg/dL (ref 7–25)
CO2: 27 mmol/L (ref 20–32)
Calcium: 9.4 mg/dL (ref 8.6–10.2)
Chloride: 104 mmol/L (ref 98–110)
Creat: 0.99 mg/dL (ref 0.50–0.99)
Globulin: 2.4 g/dL (calc) (ref 1.9–3.7)
Glucose, Bld: 71 mg/dL (ref 65–99)
Potassium: 4 mmol/L (ref 3.5–5.3)
Sodium: 139 mmol/L (ref 135–146)
Total Bilirubin: 0.9 mg/dL (ref 0.2–1.2)
Total Protein: 6.6 g/dL (ref 6.1–8.1)
eGFR: 70 mL/min/{1.73_m2} (ref >=60)

## 2022-06-05 LAB — HEMOGLOBIN A1C
Hgb A1c MFr Bld: 5.3 % of total Hgb (ref <5.7)
Mean Plasma Glucose: 105 mg/dL
eAG (mmol/L): 5.8 mmol/L

## 2022-06-05 LAB — CBC WITH DIFFERENTIAL/PLATELET
Absolute Monocytes: 342 cells/uL (ref 200–950)
Basophils Absolute: 32 cells/uL (ref 0–200)
Basophils Relative: 0.7 %
Eosinophils Absolute: 122 cells/uL (ref 15–500)
Eosinophils Relative: 2.7 %
HCT: 39.3 % (ref 35.0–45.0)
Hemoglobin: 13.2 g/dL (ref 11.7–15.5)
Lymphs Abs: 1013 cells/uL (ref 850–3900)
MCH: 32.4 pg (ref 27.0–33.0)
MCHC: 33.6 g/dL (ref 32.0–36.0)
MCV: 96.3 fL (ref 80.0–100.0)
MPV: 11.9 fL (ref 7.5–12.5)
Monocytes Relative: 7.6 %
Neutro Abs: 2993 cells/uL (ref 1500–7800)
Neutrophils Relative %: 66.5 %
Platelets: 206 10*3/uL (ref 140–400)
RBC: 4.08 10*6/uL (ref 3.80–5.10)
RDW: 11.3 % (ref 11.0–15.0)
Total Lymphocyte: 22.5 %
WBC: 4.5 10*3/uL (ref 3.8–10.8)

## 2022-06-05 LAB — MICROALBUMIN / CREATININE URINE RATIO
Creatinine, Urine: 45 mg/dL (ref 20–275)
Microalb Creat Ratio: 4 mcg/mg creat (ref <30)
Microalb, Ur: 0.2 mg/dL

## 2022-06-05 LAB — IRON, TOTAL/TOTAL IRON BINDING CAP
%SAT: 22 % (calc) (ref 16–45)
Iron: 95 ug/dL (ref 40–190)
TIBC: 423 mcg/dL (calc) (ref 250–450)

## 2022-06-05 LAB — VITAMIN B12: Vitamin B-12: 253 pg/mL (ref 200–1100)

## 2022-06-05 LAB — MICROSCOPIC MESSAGE

## 2022-06-05 LAB — METHYLMALONIC ACID, SERUM: Methylmalonic Acid, Quant: 164 nmol/L (ref 87–318)

## 2022-06-05 LAB — INSULIN, RANDOM: Insulin: 12.8 u[IU]/mL

## 2022-06-05 LAB — TSH: TSH: 0.95 mIU/L

## 2022-06-05 LAB — MAGNESIUM: Magnesium: 2 mg/dL (ref 1.5–2.5)

## 2022-07-22 ENCOUNTER — Other Ambulatory Visit: Payer: Self-pay

## 2022-07-22 DIAGNOSIS — Z1212 Encounter for screening for malignant neoplasm of rectum: Secondary | ICD-10-CM

## 2022-07-22 DIAGNOSIS — Z1211 Encounter for screening for malignant neoplasm of colon: Secondary | ICD-10-CM

## 2022-07-22 LAB — POC HEMOCCULT BLD/STL (HOME/3-CARD/SCREEN)
Card #2 Fecal Occult Blod, POC: NEGATIVE
Card #3 Fecal Occult Blood, POC: NEGATIVE
Fecal Occult Blood, POC: NEGATIVE

## 2022-12-08 LAB — HM MAMMOGRAPHY

## 2023-01-05 ENCOUNTER — Encounter: Payer: Self-pay | Admitting: Internal Medicine

## 2023-06-03 ENCOUNTER — Encounter: Payer: Self-pay | Admitting: Internal Medicine

## 2023-11-01 ENCOUNTER — Encounter: Payer: Self-pay | Admitting: Internal Medicine

## 2023-11-23 ENCOUNTER — Ambulatory Visit (AMBULATORY_SURGERY_CENTER): Payer: Self-pay

## 2023-11-23 VITALS — Ht 66.0 in | Wt 125.0 lb

## 2023-11-23 DIAGNOSIS — Z860100 Personal history of colon polyps, unspecified: Secondary | ICD-10-CM

## 2023-11-23 MED ORDER — NA SULFATE-K SULFATE-MG SULF 17.5-3.13-1.6 GM/177ML PO SOLN: 1.0000 | Freq: Once | ORAL | 0 refills | Status: AC

## 2023-11-23 NOTE — Progress Notes (Signed)

## 2023-11-24 ENCOUNTER — Encounter: Payer: Self-pay | Admitting: Internal Medicine

## 2023-12-04 NOTE — Progress Notes (Signed)
 [facility] History and Physical   Primary Care Physician:  Danielle Planas, MD   Reason for Procedure:    Encounter Diagnosis  Name Primary?   Hx of adenomatous colonic sessile serrated polyps Yes     Plan:    Colonoscopy, use pediatric scope     HPI: Danielle [name] is a 48 y.o. female status post removal of 5 diminutive polyps in 2022, 4 were adenomatous and 1 was a sessile serrated polyp.  She presents for a surveillance colonoscopy.  That colonoscopy was reported to be moderately difficult due to a fixed and somewhat tortuous sigmoid colon (it did have diverticulosis) and scope manipulation and abdominal pressure was used to achieve full colon intubation.   Past Medical History:  Diagnosis Date   Allergy    GERD (gastroesophageal reflux disease)    Hx of adenomatous colonic sessile serrated polyps 10/02/2020   4 adenomas and 1 ssp   Hx of migraines    always over left eye   Parvovirus infection 2013   Shingles 2011   UTI (lower urinary tract infection)    Vitamin D  deficiency     Past Surgical History:  Procedure Laterality Date   COLONOSCOPY     ESOPHAGOGASTRODUODENOSCOPY ENDOSCOPY  03/22/2010   WISDOM TOOTH EXTRACTION       Current Outpatient Medications  Medication Sig Dispense Refill   butalbital -acetaminophen-caffeine  (FIORICET) 50-325-40 MG tablet Take 1 to 2 tablets every 4 to 6 hours for Migraine 30 tablet 1   Cholecalciferol (VITAMIN D  PO) Take 5,000 Units by mouth.     Cyanocobalamin  (VITAMIN B12 SL) Place 1 tablet under the tongue daily.     famotidine (PEPCID) 20 MG tablet Take 20 mg by mouth as needed for heartburn or indigestion. OTC     fexofenadine (ALLEGRA) 180 MG tablet Take 180 mg by mouth daily.      Fluticasone Propionate (FLONASE NA) Place into the nose daily.     MAGNESIUM PO Take 1 tablet by mouth daily.     Multiple Vitamins-Minerals (MULTIVITAMIN ADULT PO) Take by mouth daily. (Patient not taking: Reported on 11/23/2023)      Omega-3 Fatty Acids (FISH OIL PO) Take by mouth daily.     OVER THE COUNTER MEDICATION Tumeric 1 daily     Current Facility-Administered Medications  Medication Dose Route Frequency Provider Last Rate Last Admin   0.9 %  sodium chloride  infusion  500 mL Intravenous Continuous Avram Lupita BRAVO, MD        Allergies as of 12/05/2023 - Review Complete 12/05/2023  Allergen Reaction Noted   Sulfa antibiotics Hives and Other (See Comments) 01/23/2013   Levaquin [levofloxacin in d5w] Rash 12/29/2012    Family History  Problem Relation Age of Onset   Mitral valve prolapse Mother    Throat cancer Paternal Uncle    Cancer Maternal Grandmother        breast   Cancer Maternal Grandfather        lung   Stomach cancer Paternal Grandmother    Cancer Paternal Grandfather        prostate   Colon cancer Neg Hx    Esophageal cancer Neg Hx    Colon polyps Neg Hx    Rectal cancer Neg Hx     Social History   Socioeconomic History   Marital status: Married    Spouse name: Danielle [name]   Number of children: 2   Years of education: Not on file   Highest education level: Master's degree (e.g.,  MA, MS, MEng, MEd, MSW, MBA)  Occupational History    Comment: NA  Tobacco Use   Smoking status: Never   Smokeless tobacco: Never  Vaping Use   Vaping status: Never Used  Substance and Sexual Activity   Alcohol use: Not Currently    Comment: rare   Drug use: No   Sexual activity: Not on file  Other Topics Concern   Not on file  Social History Narrative   Lives with family   Caffeine - tea, 1 c, tea x 1 in day   Social Drivers of Corporate investment banker Strain: Not on file  Food Insecurity: Not on file  Transportation Needs: Not on file  Physical Activity: Not on file  Stress: Not on file  Social Connections: Not on file  Intimate Partner Violence: Not on file    Review of Systems:  All other review of systems negative except as mentioned in the HPI.  Physical Exam: Vital signs BP  122/65   Pulse 79   Temp 98.9 F (37.2 C)   Ht 5' 6 (1.676 m)   Wt 125 lb (56.7 kg)   SpO2 100%   BMI 20.18 kg/m   General:   Alert,  Well-developed, well-nourished, pleasant and cooperative in NAD Lungs:  Clear throughout to auscultation.   Heart:  Regular rate and rhythm; no murmurs, clicks, rubs,  or gallops. Abdomen:  Soft, nontender and nondistended. Normal bowel sounds.   Neuro/Psych:  Alert and cooperative. Normal mood and affect. A and O x 3   @[name]  [name], MD, [facility] [phone] (pager) 12/05/2023 2:04 PM@

## 2023-12-05 ENCOUNTER — Ambulatory Visit: Payer: Self-pay | Admitting: Internal Medicine

## 2023-12-05 ENCOUNTER — Encounter: Payer: Self-pay | Admitting: Internal Medicine

## 2023-12-05 VITALS — BP 91/50 | HR 63 | Temp 98.9°F | Resp 12 | Ht 66.0 in | Wt 125.0 lb

## 2023-12-05 DIAGNOSIS — D123 Benign neoplasm of transverse colon: Secondary | ICD-10-CM | POA: Diagnosis not present

## 2023-12-05 DIAGNOSIS — K644 Residual hemorrhoidal skin tags: Secondary | ICD-10-CM | POA: Diagnosis not present

## 2023-12-05 DIAGNOSIS — K5730 Diverticulosis of large intestine without perforation or abscess without bleeding: Secondary | ICD-10-CM

## 2023-12-05 DIAGNOSIS — Z860101 Personal history of adenomatous and serrated colon polyps: Secondary | ICD-10-CM | POA: Diagnosis not present

## 2023-12-05 DIAGNOSIS — K648 Other hemorrhoids: Secondary | ICD-10-CM

## 2023-12-05 DIAGNOSIS — Z1211 Encounter for screening for malignant neoplasm of colon: Secondary | ICD-10-CM | POA: Diagnosis present

## 2023-12-05 MED ORDER — SODIUM CHLORIDE 0.9 % IV SOLN
500.0000 mL | INTRAVENOUS | Status: DC
Start: 2023-12-05 — End: 2023-12-05

## 2023-12-05 NOTE — Op Note (Signed)
 [facility] Patient Name: Danielle [name] Procedure Date: 12/05/2023 1:57 PM MRN: [id number] Endoscopist: Lupita FORBES Commander , MD, [id number] Age: 48 Referring MD:  Date of Birth: Jan 15, 1976 Gender: Female Account #: [id number] Procedure:                Colonoscopy Indications:              Surveillance: Personal history of adenomatous and                            sessile serratedpolyps on last colonoscopy > 3                            years ago, Last colonoscopy: 2022 Medicines:                Monitored Anesthesia Care Procedure:                Pre-Anesthesia Assessment:                           - Prior to the procedure, a History and Physical                            was performed, and patient medications and                            allergies were reviewed. The patient's tolerance of                            previous anesthesia was also reviewed. The risks                            and benefits of the procedure and the sedation                            options and risks were discussed with the patient.                            All questions were answered, and informed consent                            was obtained. Prior Anticoagulants: The patient has                            taken no anticoagulant or antiplatelet agents. ASA                            Grade Assessment: I - A normal, healthy patient.                            After reviewing the risks and benefits, the patient                            was deemed in satisfactory condition to undergo the  procedure.                           After obtaining informed consent, the colonoscope                            was passed under direct vision. Throughout the                            procedure, the patient's blood pressure, pulse, and                            oxygen saturations were monitored continuously. The                            Olympus Scope SN: [id number] was  introduced through                            the anus and advanced to the the cecum, identified                            by appendiceal orifice and ileocecal valve. The                            colonoscopy was somewhat difficult due to                            restricted mobility of the colon and a tortuous                            colon. Successful completion of the procedure was                            aided by using scope torsion, lavage and applying                            abdominal pressure when trying to enter cecum.The                            patient tolerated the procedure well. The quality                            of the bowel preparation was good. The ileocecal                            valve, appendiceal orifice, and rectum were                            photographed. The bowel preparation used was SUPREP                            via split dose instruction. Scope In: 2:12:04 PM Scope Out: 2:30:18 PM Scope Withdrawal Time: 0 hours 14 minutes 1 second  Total Procedure Duration: 0 hours  18 minutes 14 seconds  Findings:                 The perianal and digital rectal examinations were                            normal except for small fleshy anal tags.                           A 4 mm polyp was found in the hepatic flexure. The                            polyp was sessile. The polyp was removed with a                            cold snare. Resection and retrieval were complete.                            Verification of patient identification for the                            specimen was done. Estimated blood loss was minimal.                           Multiple diverticula were found in the sigmoid                            colon.                           Internal hemorrhoids were found.                           The exam was otherwise without abnormality on                            direct and retroflexion views. Complications:            No immediate  complications. Estimated Blood Loss:     Estimated blood loss was minimal. Impression:               - One 4 mm polyp at the hepatic flexure, removed                            with a cold snare. Resected and retrieved.                           - Diverticulosis in the sigmoid colon.                           - Internal hemorrhoids.                           - The examination was otherwise normal on direct  and retroflexion views except for small fleshy anal                            tags.                           - Personal history of colonic polyps. 2022 - 4                            diminutive adenomas and 1 diminutive sessile                            serrated polyp Recommendation:           - Patient has a contact number available for                            emergencies. The signs and symptoms of potential                            delayed complications were discussed with the                            patient. Return to normal activities tomorrow.                            Written discharge instructions were provided to the                            patient.                           - Resume previous diet.                           - Continue present medications.                           - Repeat colonoscopy is recommended for                            surveillance. The colonoscopy date will be                            determined after pathology results from today's                            exam become available for review. Lupita FORBES Commander, MD 12/05/2023 2:39:49 PM This report has been signed electronically.

## 2023-12-05 NOTE — Patient Instructions (Addendum)
 One tiny polyp today.  Also saw diverticulosis and internal hemorrhoids.  I will let you know pathology results and when to have another routine colonoscopy by mail and/or My Chart.  I appreciate the opportunity to care for you. Lupita [name], MD, FACG  YOU HAD AN ENDOSCOPIC PROCEDURE TODAY AT THE [facility]:   Refer to the procedure report that was given to you for any specific questions about what was found during the examination.  If the procedure report does not answer your questions, please call your gastroenterologist to clarify.  If you requested that your care partner not be given the details of your procedure findings, then the procedure report has been included in a sealed envelope for you to review at your convenience later.  YOU SHOULD EXPECT: Some feelings of bloating in the abdomen. Passage of more gas than usual.  Walking can help get rid of the air that was put into your GI tract during the procedure and reduce the bloating. If you had a lower endoscopy (such as a colonoscopy or flexible sigmoidoscopy) you may notice spotting of blood in your stool or on the toilet paper. If you underwent a bowel prep for your procedure, you may not have a normal bowel movement for a few days.  Please Note:  You might notice some irritation and congestion in your nose or some drainage.  This is from the oxygen used during your procedure.  There is no need for concern and it should clear up in a day or so.  SYMPTOMS TO REPORT IMMEDIATELY:  Following lower endoscopy (colonoscopy or flexible sigmoidoscopy):  Excessive amounts of blood in the stool  Significant tenderness or worsening of abdominal pains  Swelling of the abdomen that is new, acute  Fever of 100F or higher   For urgent or emergent issues, a gastroenterologist can be reached at any hour by calling [phone]. Do not use MyChart messaging for urgent concerns.    DIET:  We do recommend a small meal at first,  but then you may proceed to your regular diet.  Drink plenty of fluids but you should avoid alcoholic beverages for 24 hours.  ACTIVITY:  You should plan to take it easy for the rest of today and you should NOT DRIVE or use heavy machinery until tomorrow (because of the sedation medicines used during the test).    FOLLOW UP: Our staff will call the number listed on your records the next business day following your procedure.  We will call around 7:15- 8:00 am to check on you and address any questions or concerns that you may have regarding the information given to you following your procedure. If we do not reach you, we will leave a message.     If any biopsies were taken you will be contacted by phone or by letter within the next 1-3 weeks.  Please call us  at [phone] if you have not heard about the biopsies in 3 weeks.    SIGNATURES/CONFIDENTIALITY: You and/or your care partner have signed paperwork which will be entered into your electronic medical record.  These signatures attest to the fact that that the information above on your After Visit Summary has been reviewed and is understood.  Full responsibility of the confidentiality of this discharge information lies with you and/or your care-partner.

## 2023-12-05 NOTE — Progress Notes (Signed)
 Called to room to assist during endoscopic procedure.  Patient ID and intended procedure confirmed with present staff. Received instructions for my participation in the procedure from the performing physician.

## 2023-12-05 NOTE — Progress Notes (Signed)
 Pt's states no medical or surgical changes since previsit or office visit.

## 2023-12-05 NOTE — Progress Notes (Signed)
 Sedate, gd SR, tolerated procedure well, VSS, report to RN

## 2023-12-06 ENCOUNTER — Telehealth: Payer: Self-pay

## 2023-12-06 NOTE — Telephone Encounter (Signed)
 No answer, left message to call if having any issues or concerns, B.Vester Titsworth RN

## 2023-12-09 ENCOUNTER — Ambulatory Visit: Payer: Self-pay | Admitting: Internal Medicine

## 2023-12-09 DIAGNOSIS — Z860101 Personal history of adenomatous and serrated colon polyps: Secondary | ICD-10-CM

## 2023-12-09 LAB — SURGICAL PATHOLOGY
